# Patient Record
Sex: Female | Born: 1970 | Race: White | Hispanic: No | Marital: Married | Smoking: Former smoker
Health system: Southern US, Community
[De-identification: ages and names within clinical notes are randomized; demographics above are authoritative.]

## PROBLEM LIST (undated history)

## (undated) DIAGNOSIS — E079 Disorder of thyroid, unspecified: Secondary | ICD-10-CM

---

## 1998-11-11 ENCOUNTER — Other Ambulatory Visit: Admission: RE | Admit: 1998-11-11 | Discharge: 1998-11-11 | Payer: Self-pay | Admitting: Obstetrics and Gynecology

## 2002-07-21 ENCOUNTER — Inpatient Hospital Stay (HOSPITAL_COMMUNITY): Admission: EM | Admit: 2002-07-21 | Discharge: 2002-07-23 | Payer: Self-pay | Admitting: Emergency Medicine

## 2005-04-10 ENCOUNTER — Other Ambulatory Visit: Admission: RE | Admit: 2005-04-10 | Discharge: 2005-04-10 | Payer: Self-pay | Admitting: Obstetrics and Gynecology

## 2005-11-01 ENCOUNTER — Ambulatory Visit: Payer: Self-pay | Admitting: Family Medicine

## 2005-11-09 ENCOUNTER — Ambulatory Visit: Payer: Self-pay | Admitting: Family Medicine

## 2005-11-15 ENCOUNTER — Ambulatory Visit: Payer: Self-pay | Admitting: Family Medicine

## 2005-12-10 ENCOUNTER — Ambulatory Visit: Payer: Self-pay | Admitting: Endocrinology

## 2005-12-14 ENCOUNTER — Ambulatory Visit: Payer: Self-pay | Admitting: Family Medicine

## 2006-01-30 ENCOUNTER — Ambulatory Visit: Payer: Self-pay | Admitting: Family Medicine

## 2006-04-16 ENCOUNTER — Ambulatory Visit: Payer: Self-pay | Admitting: Family Medicine

## 2006-04-28 DIAGNOSIS — E039 Hypothyroidism, unspecified: Secondary | ICD-10-CM | POA: Insufficient documentation

## 2006-09-04 ENCOUNTER — Ambulatory Visit: Payer: Self-pay | Admitting: Family Medicine

## 2006-11-04 ENCOUNTER — Ambulatory Visit: Payer: Self-pay | Admitting: Family Medicine

## 2006-11-04 LAB — CONVERTED CEMR LAB
Basophils Absolute: 0 10*3/uL (ref 0.0–0.1)
Glucose, Bld: 91 mg/dL (ref 70–99)
LDL Cholesterol: 94 mg/dL (ref 0–99)
MCV: 91.5 fL (ref 78.0–100.0)
Monocytes Absolute: 0.5 10*3/uL (ref 0.2–0.7)
Monocytes Relative: 7 % (ref 3.0–11.0)
Neutro Abs: 4.6 10*3/uL (ref 1.4–7.7)
Neutrophils Relative %: 65.9 % (ref 43.0–77.0)
Total CHOL/HDL Ratio: 3
Triglycerides: 40 mg/dL (ref 0–149)
VLDL: 8 mg/dL (ref 0–40)
WBC: 7 10*3/uL (ref 4.5–10.5)

## 2006-11-05 ENCOUNTER — Telehealth (INDEPENDENT_AMBULATORY_CARE_PROVIDER_SITE_OTHER): Payer: Self-pay | Admitting: *Deleted

## 2006-11-28 ENCOUNTER — Encounter: Payer: Self-pay | Admitting: *Deleted

## 2006-12-03 ENCOUNTER — Encounter (INDEPENDENT_AMBULATORY_CARE_PROVIDER_SITE_OTHER): Payer: Self-pay | Admitting: Family Medicine

## 2007-01-06 ENCOUNTER — Telehealth (INDEPENDENT_AMBULATORY_CARE_PROVIDER_SITE_OTHER): Payer: Self-pay | Admitting: *Deleted

## 2007-01-06 ENCOUNTER — Ambulatory Visit: Payer: Self-pay | Admitting: Family Medicine

## 2007-01-07 ENCOUNTER — Ambulatory Visit: Payer: Self-pay | Admitting: Family Medicine

## 2007-04-01 ENCOUNTER — Telehealth: Payer: Self-pay | Admitting: Internal Medicine

## 2007-04-01 ENCOUNTER — Ambulatory Visit: Payer: Self-pay | Admitting: Family Medicine

## 2007-04-02 LAB — CONVERTED CEMR LAB: TSH: 2.16 microintl units/mL (ref 0.35–5.50)

## 2007-04-03 ENCOUNTER — Encounter (INDEPENDENT_AMBULATORY_CARE_PROVIDER_SITE_OTHER): Payer: Self-pay | Admitting: *Deleted

## 2007-04-08 ENCOUNTER — Telehealth (INDEPENDENT_AMBULATORY_CARE_PROVIDER_SITE_OTHER): Payer: Self-pay | Admitting: *Deleted

## 2007-05-07 ENCOUNTER — Telehealth (INDEPENDENT_AMBULATORY_CARE_PROVIDER_SITE_OTHER): Payer: Self-pay | Admitting: *Deleted

## 2007-08-29 ENCOUNTER — Telehealth (INDEPENDENT_AMBULATORY_CARE_PROVIDER_SITE_OTHER): Payer: Self-pay | Admitting: *Deleted

## 2007-09-05 ENCOUNTER — Ambulatory Visit: Payer: Self-pay | Admitting: Internal Medicine

## 2007-09-10 ENCOUNTER — Encounter (INDEPENDENT_AMBULATORY_CARE_PROVIDER_SITE_OTHER): Payer: Self-pay | Admitting: *Deleted

## 2007-10-07 ENCOUNTER — Telehealth (INDEPENDENT_AMBULATORY_CARE_PROVIDER_SITE_OTHER): Payer: Self-pay | Admitting: *Deleted

## 2008-02-16 ENCOUNTER — Ambulatory Visit: Payer: Self-pay | Admitting: Family Medicine

## 2008-02-17 ENCOUNTER — Encounter: Payer: Self-pay | Admitting: Family Medicine

## 2008-02-18 LAB — CONVERTED CEMR LAB
AST: 16 units/L (ref 0–37)
Albumin: 4.7 g/dL (ref 3.5–5.2)
Alkaline Phosphatase: 49 units/L (ref 39–117)
BUN: 16 mg/dL (ref 6–23)
Calcium: 9 mg/dL (ref 8.4–10.5)
Chloride: 103 meq/L (ref 96–112)
Glucose, Bld: 89 mg/dL (ref 70–99)
Potassium: 4 meq/L (ref 3.5–5.3)
Sodium: 138 meq/L (ref 135–145)
Total Protein: 7.8 g/dL (ref 6.0–8.3)

## 2008-07-01 ENCOUNTER — Ambulatory Visit: Payer: Self-pay | Admitting: Family Medicine

## 2008-09-15 ENCOUNTER — Telehealth: Payer: Self-pay | Admitting: Family Medicine

## 2008-09-18 ENCOUNTER — Encounter: Payer: Self-pay | Admitting: Family Medicine

## 2009-04-29 ENCOUNTER — Encounter: Payer: Self-pay | Admitting: Family Medicine

## 2009-04-29 ENCOUNTER — Telehealth: Payer: Self-pay | Admitting: Family Medicine

## 2009-05-02 LAB — CONVERTED CEMR LAB: TSH: 2.938 microintl units/mL (ref 0.350–4.500)

## 2009-06-16 ENCOUNTER — Ambulatory Visit: Payer: Self-pay | Admitting: Family Medicine

## 2009-06-16 DIAGNOSIS — G43909 Migraine, unspecified, not intractable, without status migrainosus: Secondary | ICD-10-CM | POA: Insufficient documentation

## 2009-06-17 LAB — CONVERTED CEMR LAB: T3, Free: 2.9 pg/mL (ref 2.3–4.2)

## 2009-08-10 ENCOUNTER — Encounter: Payer: Self-pay | Admitting: Family Medicine

## 2009-12-02 ENCOUNTER — Ambulatory Visit: Payer: Self-pay | Admitting: Family Medicine

## 2009-12-02 DIAGNOSIS — R21 Rash and other nonspecific skin eruption: Secondary | ICD-10-CM | POA: Insufficient documentation

## 2009-12-05 ENCOUNTER — Encounter: Payer: Self-pay | Admitting: Family Medicine

## 2009-12-13 ENCOUNTER — Encounter: Payer: Self-pay | Admitting: Family Medicine

## 2010-01-16 ENCOUNTER — Telehealth: Payer: Self-pay | Admitting: Family Medicine

## 2010-03-14 ENCOUNTER — Telehealth: Payer: Self-pay | Admitting: Family Medicine

## 2010-03-17 ENCOUNTER — Encounter: Payer: Self-pay | Admitting: Family Medicine

## 2010-04-13 NOTE — Progress Notes (Signed)
Summary: Needs TSH check

## 2010-04-13 NOTE — Assessment & Plan Note (Signed)
Summary: Hypothyroid, migraine   Vital Signs:  Patient profile:   40 year old female Height:      65 inches Weight:      168 pounds BMI:     28.06 Pulse rate:   81 / minute BP sitting:   126 / 71  (left arm) Cuff size:   regular  Vitals Entered By: Kathlene November (June 16, 2009 8:21 AM) CC: followup on thyroid med, Headache   Primary Care Provider:  Nani Gasser, MD  CC:  followup on thyroid med and Headache.  History of Present Illness: followup on thyroid med.  Was feeling less energic and TSH was borderline elevated and so we increased her dose. Has had more oily skin, loss of hair, and hair went straight, and lack of energy.  Doesn't really feel that different on the new dose of thyroid med. She is on the brand.  Migaine HA.  Hasn't had one for several months until yesterday but it was severe and she was very nauseated. Her imitrex has not been working well and she would like to try something else.  Not typically nauseated but was yesterday.  Heard about maxalt and wants to try that one.   Current Medications (verified): 1)  Synthroid 75 Mcg  Tabs (Levothyroxine Sodium) .Marland Kitchen.. 1 By Mouth Qd 2)  Multivitamins  Tabs (Multiple Vitamin) .... Take 1 Tablet By Mouth Once A Day 3)  Sumatriptan Succinate 100 Mg Tabs (Sumatriptan Succinate) .... Take As Directed 4)  Synthroid 88 Mcg Tabs (Levothyroxine Sodium) .... Take 1 Tablet By Mouth Once A Day Only 2 Days A Week.  Allergies (verified): No Known Drug Allergies  Comments:  Nurse/Medical Assistant: The patient's medications and allergies were reviewed with the patient and were updated in the Medication and Allergy Lists. Kathlene November (June 16, 2009 8:22 AM)  Physical Exam  General:  Well-developed,well-nourished,in no acute distress; alert,appropriate and cooperative throughout examination Skin:  no rashes.   Psych:  Cognition and judgment appear intact. Alert and cooperative with normal attention span and concentration. No  apparent delusions, illusions, hallucinations   Impression & Recommendations:  Problem # 1:  HYPOTHYROIDISM (ICD-244.9) Due to recheck and make sure at goal. If normal then consider tiral of compounding thyroid med or screening for depression. Does have a straong family hx of depression.  Her updated medication list for this problem includes:    Synthroid 75 Mcg Tabs (Levothyroxine sodium) .Marland Kitchen... 1 by mouth qd    Synthroid 88 Mcg Tabs (Levothyroxine sodium) .Marland Kitchen... Take 1 tablet by mouth once a day only 2 days a week.  Labs Reviewed: TSH: 2.938 (04/29/2009)    Chol: 153 (11/04/2006)   HDL: 51.3 (11/04/2006)   LDL: 94 (11/04/2006)   TG: 40 (11/04/2006)  Orders: T-TSH ([id number]) T-T3, Free [id number]) T-T4, Free ([id number])  Problem # 2:  MIGRAINE HEADACHE (ICD-346.90) Imitrex is not working well. Occ nauseated with her migraines. Will change to maxalt. let me know if not working.  Her updated medication list for this problem includes:    Sumatriptan Succinate 100 Mg Tabs (Sumatriptan succinate) .Marland Kitchen... Take as directed    Maxalt 10 Mg Tabs (Rizatriptan benzoate) .Marland Kitchen... Take 1 tablet by mouth once a day as needed  Complete Medication List: 1)  Synthroid 75 Mcg Tabs (Levothyroxine sodium) .Marland Kitchen.. 1 by mouth qd 2)  Multivitamins Tabs (Multiple vitamin) .... Take 1 tablet by mouth once a day 3)  Sumatriptan Succinate 100 Mg Tabs (Sumatriptan succinate) .... Take as directed  4)  Synthroid 88 Mcg Tabs (Levothyroxine sodium) .... Take 1 tablet by mouth once a day only 2 days a week. 5)  Maxalt 10 Mg Tabs (Rizatriptan benzoate) .... Take 1 tablet by mouth once a day as needed Prescriptions: MAXALT 10 MG TABS (RIZATRIPTAN BENZOATE) Take 1 tablet by mouth once a day as needed  #12 x 0   Entered and Authorized by:   Nani Gasser MD   Signed by:   Nani Gasser MD on 06/16/2009   Method used:   [facility]        [facility] [phone][facility] (retail)       [address] [place], [place]  [address]       Ph: [phone] or [phone]       Fax: [phone]   RxID:   [id number]

## 2010-04-13 NOTE — Progress Notes (Signed)
Summary: Wants birth control pill  Phone Note Call from Patient Call back at Home Phone [phone]   Caller: Patient Call For: Nani Gasser MD Summary of Call: Pt wants on BCP. Did have Mirena in but had to have taken out due to some complications from it. Does not want to go back and see her GYN wants to have you do her exams but not due until next year. Pt would like to try Seasonique. [facility] [address] [place] Initial call taken by: Kathlene November LPN,  January 16, 2010 1:29 PM  Follow-up for Phone Call        OK will send.  Follow-up by: Nani Gasser MD,  January 16, 2010 3:08 PM  Additional Follow-up for Phone Call Additional follow up Details #1::        pt notified Additional Follow-up by: Avon Gully CMA, Duncan Dull),  January 16, 2010 5:00 PM    New/Updated Medications: SEASONIQUE 0.15-0.03 &0.01 MG TABS (LEVONORGEST-ETH ESTRAD 91-DAY) Take 1 tablet by mouth once a day Prescriptions: SEASONIQUE 0.15-0.03 &0.01 MG TABS (LEVONORGEST-ETH ESTRAD 91-DAY) Take 1 tablet by mouth once a day  #1 pack x 3   Entered and Authorized by:   Nani Gasser MD   Signed by:   Nani Gasser MD on 01/16/2010   Method used:   [facility]ically to        [facility] [phone]* (retail)       [address] [place], [place]  [address]       Ph: [phone] or [phone]       Fax: [phone]   RxID:   [id number]

## 2010-04-13 NOTE — Miscellaneous (Signed)
Summary: Thyroid  Clinical Lists Changes  Medications: Changed medication from SYNTHROID 88 MCG TABS (LEVOTHYROXINE SODIUM) Take 1 tablet by mouth once a day only 2 days a week. [BMN] to * THYROID 1.2 GRAIN SR CAPSULE 1 capsule by mouth daily [BMN] - Signed Rx of THYROID 1.2 GRAIN SR CAPSULE 1 capsule by mouth daily;  #30 x 3 Brand medically necessary;  Signed;  Entered by: Seymour Bars DO;  Authorized by: Seymour Bars DO;  Method used: [facility]  [address] [phone]*, [address], [place], [place]  [address], Ph: [phone] or [phone], Fax: [phone]    Prescriptions: THYROID 1.2 GRAIN SR CAPSULE 1 capsule by mouth daily Brand medically necessary #30 x 3   Entered and Authorized by:   Seymour Bars DO   Signed by:   Seymour Bars DO on 08/10/2009   Method used:   Telephoned to ...       [facility]  [place] [phone]* (retail)       [address] [place], [place]  [address]       Ph: [phone] or [phone]       Fax: [phone]   RxID:   [id number]

## 2010-04-13 NOTE — Medication Information (Signed)
Summary: Approval for Triptan/[employer]  Approval for Triptan/[employer]   Imported By: Lanelle Bal 12/28/2009 10:48:25  _____________________________________________________________________  External Attachment:    Type:   Image     Comment:   External Document

## 2010-04-13 NOTE — Assessment & Plan Note (Signed)
Summary: Rash, migraines, thyroid   Vital Signs:  Patient profile:   40 year old female Height:      65 inches Weight:      168 pounds Pulse rate:   58 / minute BP sitting:   140 / 93  (right arm) Cuff size:   regular  Vitals Entered By: Avon Gully CMA, Duncan Dull) (December 02, 2009 3:25 PM) CC: rash on the back x 2-3 days, very itchy   Primary Care Loys Hoselton:  Nani Gasser, MD  CC:  rash on the back x 2-3 days and very itchy.  History of Present Illness: rash on the back x 2-3 days, very itchy. Never had this before.  Rash has similar rash.  Tried cortisone but it burned.  Husband uses lotramin on his rash. no fever.  He breaks about about twice a month. Usually lasts fora week. Husband has never had his diagnosed.  Pt with no alleviating sxs.      Current Medications (verified): 1)  Compunded Thyroid Hormone 1.2 Grains .Marland Kitchen.. 1 By Mouth Once Daily 2)  Multivitamins  Tabs (Multiple Vitamin) .... Take 1 Tablet By Mouth Once A Day 3)  Sumatriptan Succinate 100 Mg Tabs (Sumatriptan Succinate) .... Take As Directed 4)  Thyroid 1.2 Grain Sr Capsule .Marland KitchenMarland Kitchen. 1 Capsule By Mouth Daily 5)  Maxalt 10 Mg Tabs (Rizatriptan Benzoate) .... Take 1 Tablet By Mouth Once A Day As Needed  Allergies (verified): No Known Drug Allergies  Comments:  Nurse/Medical Assistant: The patient's medications and allergies were reviewed with the patient and were updated in the Medication and Allergy Lists. Avon Gully CMA, Duncan Dull) (December 02, 2009 3:26 PM)  Past History:  Past Medical History: Last updated: 02/16/2008 Hypothyroidism Gyn - [facility] in [place] - Dr. Richardean Chimera  Physical Exam  General:  Well-developed,well-nourished,in no acute distress; alert,appropriate and cooperative throughout examination Skin:  Right low back with erythematou base with papular lesions and a few very small vesicles     Impression & Recommendations:  Problem # 1:  SKIN RASH  (ICD-782.1) BAse don exam I am most suspicous of herpes Viral culture obtained Will tx with acyclovir while await the cutlure.   Discussed basics of the potential dx.  Orders: T-Herpes Culture (Routine) ([id number])  Problem # 2:  MIGRAINE HEADACHE (ICD-346.90) She is not well controlled right now.  Wil try zomig since failed imitrex and maxalt. Also called in a a few fiorecet.  F/U in one month to discuss maybe prophylactic medications.  The following medications were removed from the medication list:    Sumatriptan Succinate 100 Mg Tabs (Sumatriptan succinate) .Marland Kitchen... Take as directed    Maxalt 10 Mg Tabs (Rizatriptan benzoate) .Marland Kitchen... Take 1 tablet by mouth once a day as needed Her updated medication list for this problem includes:    Zomig 5 Mg Tabs (Zolmitriptan) .Marland Kitchen... Take 1 tablet by mouth once a day. can repeat in 2 hours    Fioricet 50-325-40 Mg Tabs (Butalbital-apap-caffeine) .Marland Kitchen... 1- 2 tab once daily as needed for migraine.  Problem # 3:  HYPOTHYROIDISM (ICD-244.9) Due to recheck her levels.  Orders: T-TSH [phone])  Orders: T-TSH [phone])  Complete Medication List: 1)  Compunded Thyroid Hormone 1.2 Grains  .Marland KitchenMarland Kitchen. 1 by mouth once daily 2)  Multivitamins Tabs (Multiple vitamin) .... Take 1 tablet by mouth once a day 3)  Thyroid 1.2 Grain Sr Capsule  .Marland Kitchen.. 1 capsule by mouth daily 4)  Zomig 5 Mg Tabs (Zolmitriptan) .... Take 1 tablet by  mouth once a day. can repeat in 2 hours 5)  Fioricet 50-325-40 Mg Tabs (Butalbital-apap-caffeine) .Marland Kitchen.. 1- 2 tab once daily as needed for migraine. 6)  Zovirax 400 Mg Tabs (Acyclovir) .... Take 1 tablet by mouth three times a day for 10 days  Patient Instructions: 1)  Please schedule a follow-up appointment in 1 month for migraines.  Prescriptions: ZOVIRAX 400 MG TABS (ACYCLOVIR) Take 1 tablet by mouth three times a day for 10 days  #30 x 0   Entered and Authorized by:   Nani Gasser MD   Signed by:   Nani Gasser MD on  12/02/2009   Method used:   [facility]ronically to        [facility] [phone]* (retail)       [address] [place], [place]  [address]       Ph: [phone] or [phone]       Fax: [phone]   RxID:   [id number] FIORICET 50-325-40 MG TABS (BUTALBITAL-APAP-CAFFEINE) 1- 2 tab once daily as needed for migraine.  #15 x 0   Entered and Authorized by:   Nani Gasser MD   Signed by:   Nani Gasser MD on 12/02/2009   Method used:   Electronically to        [facility] [phone]* (retail)       [address] [place], [place]  [address]       Ph: [phone] or [phone]       Fax: [phone]   RxID:   [id number] ZOMIG 5 MG TABS (ZOLMITRIPTAN) Take 1 tablet by mouth once a day. Can repeat in 2 hours  #6 x 0   Entered and Authorized by:   Nani Gasser MD   Signed by:   Nani Gasser MD on 12/02/2009   Method used:   Electronically to        [facility] [phone]* (retail)       [address] [place], [place]  [address]       Ph: [phone] or [phone]       Fax: [phone]   RxID:   [id number]

## 2010-04-13 NOTE — Progress Notes (Signed)
Summary: BCP side effects  Phone Note Call from Patient Call back at [phone]   Caller: Patient Call For: Nani Gasser MD Summary of Call: Pt is in her second month of seasonique. Pt started her period 12/23 and is still having a full blown period with cramping and bloating. Pt is also having night sweats, which started at the end of  dec. Also thinks this is affecting thyroid medications because she is feeling tired and down and has the "blues".Please advise Initial call taken by: Avon Gully CMA, Duncan Dull),  March 14, 2010 11:40 AM  Follow-up for Phone Call        Let recheck her thyroid as has been 3 months so need to recheck anyway. If she would like to change birth controls we can change to more traditional 1 month pill if wouldlike.  Follow-up by: Nani Gasser MD,  March 14, 2010 12:16 PM  Additional Follow-up for Phone Call Additional follow up Details #1::        pt notified.will go to lab.order faxed Additional Follow-up by: Avon Gully CMA, Duncan Dull),  March 14, 2010 1:06 PM     Appended Document: BCP side effects 03/14/09 acm 1:34 }  pt states she will just stay on the seasonique and "tough it out" because she really likes only havinga period 4 times a year. Will schedule appt if new se occur if she cant stand it any longer.

## 2010-07-28 NOTE — Discharge Summary (Signed)
NAMEMARCI, POLITO                           ACCOUNT NO.:  [id number]   MEDICAL RECORD NO.:  [id number]                   PATIENT TYPE:  INP   LOCATION:  4098                                 FACILITY:  [facility]   PHYSICIAN:  Sherin Quarry, MD                   DATE OF BIRTH:  02-17-1971   DATE OF ADMISSION:  07/21/2002  DATE OF DISCHARGE:                                 DISCHARGE SUMMARY   HISTORY:  Sarah Huerta is a previously healthy 40 year old lady who reported  the onset of headache and backache about one week prior to admission.  The  same night, she developed chills and fever.  The following Wednesday, she  went to [facility] and was diagnosed with a urinary tract infection due to  the presence of hematuria.  She was given an antibiotic, possibly a  quinolone, with instructions to take 1 tablet daily for 7 days.  On Tuesday,  she developed a temperature up to 104.  On Friday, she developed a truncal  rash, which definitely began in the chest and abdomen area and then spread  subsequently to the extremities.  Out of concern about possible [facility] Spotted Fever, she was placed on doxycycline 100 mg b.i.d.  Headache and backache seemed to improve, but the rash appeared to have  worsened, and the patient was complaining of severe burning of her skin as  well as nausea.  She denied any associated cough, shortness of breath, chest  pain, abdominal pain, change in bowel habits, dysuria or menstrual problems.  She, therefore, presented to the [facility] on May 11 with  the associated worsening rash.   PHYSICAL EXAMINATION:  VITAL SIGNS:  Her temperature is 100.6; pulse is 111,  blood pressure 113/63.  GENERAL:  This was a somewhat obese lady who had a generalized macular rash.  The patient did not appear gravely ill but was quite uncomfortable from the  burning and itching from the rash.  HEENT:  Exam was within normal limits.  CHEST:  The chest was clear.  BACK:  Examination of the back revealed no CVA or point tenderness.  CARDIOVASCULAR:  Exam reveals normal S1 and S2.  There are no rubs, murmurs  or gallops.  ABDOMEN:  The abdomen was benign.  There are normal bowel sounds.  There are  no masses or tenderness.  No guarding or rebound.  There was no  organomegaly.  NEUROLOGIC:  Testing was within normal limits.  EXTREMITIES:  The examination of extremities revealed a generalized  blanching macular rash present, diffusely, on the trunk and extremities.  It  was on the dorsum of the hands and feet but no on the palms or soles.  There  was no petechial rash.  There was no cyanosis or edema.   LABORATORY STUDIES:  Initial laboratory studies included the white count of  9,300;  hemoglobin was 11.9; there were 83% neutrophils; the platelet count  was 173,000.  Sodium was 134; potassium was 3.0; glucose is 111; creatinine  was 0.7.  Initially, liver function studies revealed an AST of 382, ALT of  365, alkaline phosphatase of 117, total bilirubin of 1.2.  Serial blood  cultures were obtained and are negative to date.  A [place] Spotted  Fever IgM and IgG serology were obtained, and these results are pending.  Urine studies were remarkable only for the presence of ketones.  Monospot  was negative.  Thyroid profile was normal.  Hepatitis profile was obtained.  This revealed negative hepatitis A IgM, hepatitis B surface antigen and  antibody, hepatitis B core IgM and hepatitis C serology.  Subsequently, a  liver profile was monitored and improved steadily on each day.  By May 13,  the SGOT was 104, SGPT 232, total bilirubin and alkaline phosphatase  remained normal.   HOSPITAL COURSE:  On admission, the patient was placed on IV of D5-1/2  normal saline with 10 mEq of KCl at 150 cc/hour.  Phenergan was administered  for nausea.  The patient was placed on doxycycline 100 mg IV every 12 hours  and was given Atarax 25 mg t.i.d. for rash.    The patient's condition improved strikingly during the course of her  hospitalization.  She defervesced.  The rash appeared to completely resolve,  and, by May 13, she was feeling quite well.  By my calculation, May 13 was  the sixth day, total, of doxycycline therapy.  Therefore, on May 13, it was  felt reasonable to discharge the patient.   DISCHARGE DIAGNOSIS:  1. Febrile illness with rash, rule out [facility] Spotted Fever.  2. Nausea and vomiting of uncertain etiology.  3. Abnormal liver function studies, possibly associated with febrile     illness, consider a viral origin.   DISCHARGE MEDICATIONS:  The patient was instructed to continue Atarax 25 mg  t.i.d. for another 5 to 7 days.  She was also advised to continue  doxycycline 100 mg b.i.d. for 5 additional days.   FOLLOW UP:  She will follow up with Dr. Blossom Hoops at [facility], who is the  physician that sees her husband.  It would be advisable to obtain a  convalescent IgG serology to confirm the diagnosis of [facility] Spotted  Fever.  If I receive any reports about these serologies for this patient, I  will certainly let Dr. Blossom Hoops know.   DISCHARGE CONDITION:  Her condition at the time of discharge was good.                                               Sherin Quarry, MD    SY/MEDQ  D:  07/23/2002  T:  07/23/2002  Job:  [id number]   cc:   Dr. Soundra Pilon [facility]

## 2010-07-28 NOTE — Consult Note (Signed)
[facility]                            ENDOCRINOLOGY CONSULTATION   Sarah Huerta, Sarah Huerta                        MRN:          [id number]  DATE:12/10/2005                            DOB:          05-31-1970    REFERRING PHYSICIAN:  Leanne Chang, M.D.   REASON FOR REFERRAL:  Hypothyroidism.   HISTORY OF PRESENT ILLNESS:  41 year old woman who states that in January  2007, she was found to have a slightly elevated TSH by Dr. Arelia Sneddon.  It was  repeated in April and it was again abnormal and he prescribed Synthroid 50  mcg a day.  Upon a recheck in May 2007, the TSH value indicated a need to  increase it to 75 mcg a day.  On 75 mcg a day, she states that she started  to have some moderate headache with a question of associated staining on her  teeth.  On November 09, 2005, her TSH was 4.11 on Synthroid 75 mcg a day.  Because of the above symptoms, she discontinued the Synthroid.  She states  that since then she has had some unexplained crying spells.   PAST MEDICAL HISTORY:  Otherwise  healthy, no other medications.   SOCIAL HISTORY:  She is married, she works as a Dance movement psychotherapist.   FAMILY HISTORY:  Positive for depression.  Her mother takes an uncertain  type of thyroid medication.   REVIEW OF SYSTEMS:  She has gained 7 pounds in the past seven months or so.  She denies sore throat.   PHYSICAL EXAMINATION:  VITAL SIGNS:  Blood pressure 129/82, heart rate 95, temperature 99, weight  178.  GENERAL:  In no distress.  SKIN:  Not diaphoretic.  EYES:  No proptosis, no periorbital swelling.  NECK:  Thyroid is slightly enlarged with an irregular surface.  CHEST:  Clear to auscultation, no respiratory distress.  CARDIOVASCULAR:  No JVD, no edema, regular rate and rhythm, no murmur.  Gait  is observed in the office to be normal.  NEUROLOGICAL:  She is alert, she is well oriented.  She is tearful during  the visit.  Sensation is diffusely intact to touch  and there is no tremor.   IMPRESSION:  1. Mild hypothyroidism.  2. Perceived intolerance to Synthroid characterized by headache and      question of staining on the teeth.  3. Although she states she does not have any personal history of      depression, that would appear to be a reasonable diagnosis here.   PLAN:  1. The patient requests repeat of her TSH off the Synthroid.  Later this      week, it will have been a month since she has taken the Synthroid, so      she will have a TSH checked then.  2. We discussed the fact that I believe that her Synthroid side-effects      are coincidental.            ______________________________  Cleophas Dunker. Everardo All, MD     SAE/MedQ  DD:  12/11/2005  DT:  12/12/2005  Job #:  [id number]   cc:   Leanne Chang, M.D.  Juluis Mire, M.D.

## 2010-07-28 NOTE — H&P (Signed)
Sarah Huerta, Sarah Huerta                           ACCOUNT NO.:  [id number]   MEDICAL RECORD NO.:  [id number]                   PATIENT TYPE:  EMS   LOCATION:  ED                                   FACILITY:  [facility]   PHYSICIAN:  Sherin Quarry, MD                   DATE OF BIRTH:  11/15/1970   DATE OF ADMISSION:  07/21/2002  DATE OF DISCHARGE:                                HISTORY & PHYSICAL   HISTORY OF PRESENT ILLNESS:  Sarah Huerta is a previously healthy 40 year old  woman who reported that one week prior to this presentation, she suddenly  developed the onset of headache and backache.  That night, she developed  chills and fever.  The next day, she went to [facility], where workup done  was remarkable for the presence of blood in the urine.  The patient was,  therefore, diagnosed with a urinary tract infection.  She was given an  antibiotic that she was instructed to take once per day for 7 days, possibly  a quinolone.  The next day, her temperature was up to 104 degrees.  On  Friday, the patient developed a truncal rash, and because of concern about  possible [facility] spotted fever, was placed on doxycycline 100 mg  b.i.d.  She states that her headache and backache gradually resolved such  that by Saturday these problems were pretty much gone.  However, the rash  progressively worsened and was associated with a feeling of burning of her  skin and nausea.  She states that she had no associated earache, sinus pain,  sore throat, cough, wheezing, shortness of breath, chest pain, abdominal  pain, change in bowel habits, dysuria, hematuria or change in her menstrual  periods.  Her last menstrual period was April 25.  She generally takes no  medications.  There is no recent history of travel.  After evaluation in the  emergency room, it was felt prudent to admit the patient for observation and  treatment.   PAST MEDICAL HISTORY:  None.   ALLERGIES:  None.   MEDICATIONS:  The  patient takes no medications regularly except as noted  above.   OPERATIONS:  She has had a previous C-section.   ILLNESSES:  None.   FAMILY HISTORY:  The patient's father died of a trauma.  Her mother has  history of diabetes and hypertension.  Her sister has a thyroid problem.   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  No one that she works with has been sick.  Her son, who is 65 years old, is  in good health and has not had any recent viral illnesses.  Her husband is  also well.  She has had no recent travel history.  She denies use of alcohol  or drugs.   REVIEW OF SYSTEMS:  HEAD:  See above.  EYES:  She denies visual blurring,  diplopia.  EAR, NOSE & THROAT:  She denies earache, sinus pain or sore  throat.  CHEST:  She denies coughing, wheezing or chest congestion.  CARDIOVASCULAR:  She denies orthopnea, PND or ankle edema.  GASTROINTESTINAL:  She has been nauseated.  She denies hematemesis or  melena.  There has been no abdominal pain.  GENITOURINARY:  There has been  no dysuria, hematuria or nocturia.  RHEUMATOLOGIC:  She has had back  discomfort.  There has been no joint discomfort.  NEUROLOGIC:  There is no  history of seizure or stroke.  EXTREMITIES:  Please see above.   PHYSICAL EXAMINATION:  VITAL SIGNS:  The temperature is 100.6; pulse is 111,  blood pressure 113/63; oxygen saturation is 98%.  GENERAL:  The patient is a somewhat obese lady who has a generalized rash.  She does not appear seriously ill, although the rash is quite uncomfortable  to her.  HEENT:  Within normal limits.  CHEST:  Clear to auscultation and percussion.  BACK:  Revealed no CVA or point tenderness.  CARDIOVASCULAR:  Revealed normal S1 and S2.  There are no rubs, murmurs or  gallops.  The heart rate was slightly increased.  ABDOMEN:  Was very benign.  There were normal bowel sounds.  There are no  masses or tenderness.  No organomegaly.  NEUROLOGIC:  Testing  was within normal limits.  EXTREMITIES:  Reveals a generalized blanching macular rash, which is present  diffusely on the trunk and extremities.  It is notable on the dorsum of the  hands and feet.  Does not seem to be present on the palms and soles.  There  is no petechial rash.  Examination of extremities otherwise reveals no  cyanosis or edema.   LABORATORY DATA:  The white count is 9,300, platelets 173,000; hemoglobin is  11.9.  The electrolytes are notable for a potassium of 3.0; the glucose is  111.  The SGOT is 382, SGPT 365, alkaline phosphatase 117; bilirubin level  is 1.2.   IMPRESSION:  1. Febrile illness with rash.  Although the white blood cell count, platelet     count and appearance of the rash would be very atypical for [facility] spotted fever, this condition should still be considered and     should be treated for.  Other possibilities would include enterovirus     infection, reaction to medication, although there is no obvious     associated medication here, mononucleosis, or hepatitis of viral origin.  2. Nausea and vomiting, possibly exacerbated by medications.  3. Possible history of urinary tract infection.  4. Abnormal liver function tests.   PLAN:  1. The patient will be admitted for intravenous fluids and observation.  2. We will continue doxycycline by the IV route.  3. We will obtain a hepatitis profile, Monospot, thyroid profile and also     follow liver functions.  4. [facility] spotted fever serology has been obtained and is pending.  5. We will also recheck the urinalysis.  6. We will follow the patient closely.                                               Sherin Quarry, MD    SY/MEDQ  D:  07/21/2002  T:  07/21/2002  Job:  [id number]

## 2010-09-22 ENCOUNTER — Telehealth: Payer: Self-pay | Admitting: Family Medicine

## 2010-09-22 DIAGNOSIS — E039 Hypothyroidism, unspecified: Secondary | ICD-10-CM

## 2010-09-22 NOTE — Telephone Encounter (Signed)
Pt needs thyroid level checked. I will refill thyroid med for one more months but needs to check her labs. We can fax order downstairs.

## 2010-09-25 NOTE — Telephone Encounter (Signed)
LMOM advising pt of rec and refill

## 2010-09-27 ENCOUNTER — Telehealth: Payer: Self-pay | Admitting: Family Medicine

## 2010-09-27 ENCOUNTER — Other Ambulatory Visit: Payer: Self-pay | Admitting: Family Medicine

## 2010-09-27 MED ORDER — AMBULATORY NON FORMULARY MEDICATION: Status: DC

## 2010-09-27 NOTE — Telephone Encounter (Signed)
Call pt: Thyroid looks good. I will send over RF.

## 2010-09-27 NOTE — Telephone Encounter (Signed)
Pt informed that her thyroid looked good on recent labs.  Told refill sent. Jarvis Newcomer, LPN Domingo Dimes

## 2010-10-13 ENCOUNTER — Encounter: Payer: Self-pay | Admitting: Family Medicine

## 2010-10-13 ENCOUNTER — Inpatient Hospital Stay (INDEPENDENT_AMBULATORY_CARE_PROVIDER_SITE_OTHER)
Admission: RE | Admit: 2010-10-13 | Discharge: 2010-10-13 | Disposition: A | Discharge status: Home / Self Care | Payer: BC Managed Care – PPO | Source: Ambulatory Visit | Attending: Family Medicine | Admitting: Family Medicine

## 2010-10-13 DIAGNOSIS — J042 Acute laryngotracheitis: Secondary | ICD-10-CM | POA: Insufficient documentation

## 2010-10-13 DIAGNOSIS — J209 Acute bronchitis, unspecified: Secondary | ICD-10-CM

## 2010-10-13 DIAGNOSIS — E785 Hyperlipidemia, unspecified: Secondary | ICD-10-CM | POA: Insufficient documentation

## 2010-10-13 DIAGNOSIS — E119 Type 2 diabetes mellitus without complications: Secondary | ICD-10-CM | POA: Insufficient documentation

## 2010-10-13 DIAGNOSIS — I10 Essential (primary) hypertension: Secondary | ICD-10-CM | POA: Insufficient documentation

## 2010-10-24 ENCOUNTER — Other Ambulatory Visit: Payer: Self-pay | Admitting: Family Medicine

## 2010-10-24 MED ORDER — AMBULATORY NON FORMULARY MEDICATION: Status: DC

## 2011-01-26 ENCOUNTER — Emergency Department
Admission: EM | Admit: 2011-01-26 | Discharge: 2011-01-26 | Disposition: A | Discharge status: Home / Self Care | Payer: BC Managed Care – PPO | Source: Home / Self Care | Attending: Family Medicine | Admitting: Family Medicine

## 2011-01-26 ENCOUNTER — Encounter: Payer: Self-pay | Admitting: *Deleted

## 2011-01-26 DIAGNOSIS — J069 Acute upper respiratory infection, unspecified: Secondary | ICD-10-CM

## 2011-01-26 HISTORY — DX: Disorder of thyroid, unspecified: E07.9

## 2011-01-26 MED ORDER — BENZONATATE 200 MG PO CAPS: 200.0000 mg | ORAL_CAPSULE | Freq: Every day | ORAL | Status: AC

## 2011-01-26 MED ORDER — AZITHROMYCIN 250 MG PO TABS: ORAL_TABLET | ORAL | Status: AC

## 2011-01-26 NOTE — ED Notes (Signed)
Patient c/o sinus drainage, productive cough, HA x 9 days. Taken Claritin, advil and cough drops.

## 2011-01-29 NOTE — ED Provider Notes (Signed)
History     CSN: [id number] Arrival date & time: 01/26/2011 10:34 AM   First MD Initiated Contact with Patient 01/26/11 1115      Chief Complaint  Patient presents with  . Cough  . Sinusitis     HPI Comments: Patient complains of approximately 9 day history of gradually progressive URI symptoms beginning with a mild sore throat (now improved), followed by progressive nasal congestion.  A cough started 8 days ago.  Complains of fatigue and initial myalgias.  Cough is now worse at night and generally productive during the day.  There has been no pleuritic pain, shortness of breath, or wheezes.  She now often coughs until she gags.  Her last tetanus shot was in 2008.  Patient is a 40 y.o. female presenting with cough and sinusitis. The history is provided by the patient.  Cough This is a new problem. The current episode started more than 1 week ago. The problem occurs hourly. The problem has been gradually worsening. The cough is productive of sputum. Maximum temperature: Low grade fever occasionally. The fever has been present for 1 to 2 days. Associated symptoms include chills, headaches, rhinorrhea and sore throat. Pertinent negatives include no chest pain, no sweats, no ear congestion, no ear pain, no myalgias, no shortness of breath, no wheezing and no eye redness. She has tried cough syrup for the symptoms. The treatment provided mild relief. She is not a smoker.  Sinusitis  Associated symptoms include chills, congestion, sore throat and cough. Pertinent negatives include no sweats, no ear pain, no sinus pressure and no shortness of breath.    Past Medical History  Diagnosis Date  . Thyroid disease     Past Surgical History  Procedure Date  . Cesarean section     History reviewed. No pertinent family history.  History  Substance Use Topics  . Smoking status: Former Smoker    Quit date: 01/26/1996  . Smokeless tobacco: Not on file  . Alcohol Use: No    OB History    Grav Para Term Preterm Abortions TAB SAB Ect Mult Living                  Review of Systems  Constitutional: Positive for fever and chills. Negative for activity change.  HENT: Positive for congestion, sore throat, rhinorrhea and postnasal drip. Negative for ear pain, trouble swallowing and sinus pressure.   Eyes: Negative for redness.  Respiratory: Positive for cough. Negative for shortness of breath and wheezing.   Cardiovascular: Negative for chest pain.  Gastrointestinal: Positive for constipation.  Genitourinary: Negative.   Musculoskeletal: Negative.  Negative for myalgias.  Skin: Negative.   Neurological: Positive for headaches.    Allergies  Review of patient's allergies indicates no known allergies.  Home Medications   Current Outpatient Rx  Name Route Sig Dispense Refill  . AMBULATORY NON FORMULARY MEDICATION  Medication Name: thyroid 1.75 grain SR Capsules T1C PO BID 30 capsule 5  . AZITHROMYCIN 250 MG PO TABS  Take 2 tabs today; then begin one tab once daily for 4 more days.  6 each 0  . BENZONATATE 200 MG PO CAPS Oral Take 1 capsule (200 mg total) by mouth at bedtime. 12 capsule 0    BP 123/84  Pulse 69  Temp(Src) 98.2 F (36.8 C) (Oral)  Resp 14  Ht 5\' 4"  (1.626 m)  Wt 161 lb (73.029 kg)  BMI 27.64 kg/m2  SpO2 100%  LMP 01/10/2011  Physical Exam  Nursing  note and vitals reviewed. Constitutional: She is oriented to person, place, and time. She appears well-developed and well-nourished. No distress.  HENT:  Head: Normocephalic.  Right Ear: External ear normal.  Left Ear: External ear normal.  Nose: Mucosal edema and rhinorrhea present. No sinus tenderness.  Mouth/Throat: Oropharynx is clear and moist. No oropharyngeal exudate.  Eyes: Conjunctivae and EOM are normal. Pupils are equal, round, and reactive to light. Right eye exhibits no discharge. Left eye exhibits no discharge.  Neck: Normal range of motion. Neck supple.  Cardiovascular: Normal rate,  regular rhythm and normal heart sounds.   Pulmonary/Chest: Effort normal and breath sounds normal. No respiratory distress. She has no wheezes. She has no rales. She exhibits no tenderness.  Abdominal: Soft. Bowel sounds are normal. There is no tenderness.  Musculoskeletal: She exhibits no edema.  Lymphadenopathy:    She has cervical adenopathy.       Right cervical: Posterior cervical adenopathy present.       Left cervical: Posterior cervical adenopathy present.  Neurological: She is alert and oriented to person, place, and time.  Skin: Skin is warm and dry. She is not diaphoretic.    ED Course  Procedures:  None      1. Acute upper respiratory infections of unspecified site       MDM  ? Pertussis Begin Azithromycin. Begin expectorant/decongestant, topical decongestant, saline nasal spray and/or saline irrigation, and cough suppressant at bedtime.  Avoid antihistamines for now. Increase fluid intake, rest. Followup with PCP if not improving 7 to 10 days.         Donna Christen, MD 01/29/11 [phone]

## 2011-02-12 NOTE — Progress Notes (Signed)
Summary: SORE THROAT/CONGESTION rm 5   Vital Signs:  Patient Profile:   40 Years Old Female CC:      congestion Height:     65 inches Weight:      156.25 pounds O2 Sat:      97 % O2 treatment:    Room Air Temp:     98.4 degrees F oral Pulse rate:   74 / minute Resp:     16 per minute BP sitting:   116 / 75  (left arm) Cuff size:   regular  Vitals Entered By: Clemens Catholic LPN (October 13, 2010 8:50 AM)                  Prior Medication List:  MULTIVITAMINS  TABS (MULTIPLE VITAMIN) Take 1 tablet by mouth once a day * THYROID 1.75 GRAIN SR CAPSULE 1 capsule by mouth daily [BMN] ZOMIG 5 MG TABS (ZOLMITRIPTAN) Take 1 tablet by mouth once a day. Can repeat in 2 hours FIORICET 50-325-40 MG TABS (BUTALBITAL-APAP-CAFFEINE) 1- 2 tab once daily as needed for migraine. ZOVIRAX 400 MG TABS (ACYCLOVIR) Take 1 tablet by mouth three times a day for 10 days SEASONIQUE 0.15-0.03 &0.01 MG TABS (LEVONORGEST-ETH ESTRAD 91-DAY) Take 1 tablet by mouth once a day   Updated Prior Medication List: MULTIVITAMINS  TABS (MULTIPLE VITAMIN) Take 1 tablet by mouth once a day * THYROID 1.75 GRAIN SR CAPSULE 1 capsule by mouth daily [BMN]  Current Allergies (reviewed today): No known allergies History of Present Illness Chief Complaint: congestion History of Present Illness: Patient has been out of the country on a mission trip. She went snorkaling on Monday before flying home on Tuesday and became sick on Wednesday. She has been running a fever, coughing,been  hoarse and has a sore throat.She has not been able to sleep at night either.   Current Problems: BRONCHITIS, ACUTE (ICD-466.0) ACUTE LARYNGOTRACHEITIS W/O MENTION OBSTRUCTION (ICD-464.20) HYPERTENSION (ICD-401.9) HYPERLIPIDEMIA (ICD-272.4) DIABETES MELLITUS, TYPE II (ICD-250.00) SKIN RASH (ICD-782.1) MIGRAINE HEADACHE (ICD-346.90) EXAMINATION, ROUTINE MEDICAL (ICD-V70.0) FAMILY HISTORY DIABETES 1ST DEGREE RELATIVE  (ICD-V18.0) HYPOTHYROIDISM (ICD-244.9)   Current Meds MULTIVITAMINS  TABS (MULTIPLE VITAMIN) Take 1 tablet by mouth once a day * THYROID 1.75 GRAIN SR CAPSULE 1 capsule by mouth daily [BMN] TUSSIONEX PENNKINETIC ER 8-10 MG/5ML LQCR (CHLORPHENIRAMINE-HYDROCODONE) 1/2 to 1 tsp by mouth twice aday ZITHROMAX Z-PAK 250 MG  TABS (AZITHROMYCIN) Use as directed  REVIEW OF SYSTEMS Constitutional Symptoms       Complains of fever, chills, and night sweats.     Denies weight loss, weight gain, and fatigue.  Eyes       Denies change in vision, eye pain, eye discharge, glasses, contact lenses, and eye surgery. Ear/Nose/Throat/Mouth       Complains of ear pain, sinus problems, and sore throat.      Denies hearing loss/aids, change in hearing, ear discharge, dizziness, frequent runny nose, frequent nose bleeds, hoarseness, and tooth pain or bleeding.  Respiratory       Complains of dry cough.      Denies productive cough, wheezing, shortness of breath, asthma, bronchitis, and emphysema/COPD.  Cardiovascular       Denies murmurs, chest pain, and tires easily with exhertion.    Gastrointestinal       Denies stomach pain, nausea/vomiting, diarrhea, constipation, blood in bowel movements, and indigestion. Genitourniary       Denies painful urination, kidney stones, and loss of urinary control. Neurological       Denies  paralysis, seizures, and fainting/blackouts. Musculoskeletal       Denies muscle pain, joint pain, joint stiffness, decreased range of motion, redness, swelling, muscle weakness, and gout.  Skin       Denies bruising, unusual mles/lumps or sores, and hair/skin or nail changes.  Psych       Denies mood changes, temper/anger issues, anxiety/stress, speech problems, depression, and sleep problems. Other Comments: pt c/o chest congestion, productive cough, hoarseness, bilateral ear pain, and sore throat x 2 days. she has taken Mucinex D and Advil.   Past History:  Family History: Last  updated: 11/04/2006 Family History Diabetes 1st degree relative Family History High cholesterol Family History Hypertension  Social History: Last updated: 02/16/2008 Occupation: pension Systems developer Married to [employer] and with one son.   Alcohol use-yes Drug use-no Regular exercise-yes Former Smoker  Risk Factors: Alcohol Use: <1 (02/16/2008) Caffeine Use: 1 (02/16/2008) Exercise: yes (11/04/2006)  Risk Factors: Smoking Status: quit (02/16/2008)  Past Medical History: Hypothyroidism Gyn - [facility] in [place] - Dr. Richardean Chimera Diabetes mellitus, type II Hyperlipidemia Hypertension  Past Surgical History: Reviewed history from 11/04/2006 and no changes required. c/s  Family History: Reviewed history from 11/04/2006 and no changes required. Family History Diabetes 1st degree relative Family History High cholesterol Family History Hypertension  Social History: Reviewed history from 02/16/2008 and no changes required. Occupation: pension Systems developer Married to [employer] and with one son.   Alcohol use-yes Drug use-no Regular exercise-yes Former Smoker Physical Exam General appearance: well developed, well nourished, no acute distress Head: normocephalic, atraumatic Ears: normal, no lesions or deformities Oral/Pharynx: pharyngeal erythema without exudate, uvula midline without deviation Neck: supple,anterior lymphadenopathy present Chest/Lungs: no rales, wheezes, or rhonchi bilateral, breath sounds equal without effort Heart: regular rate and  rhythm, no murmur Extremities: normal extremities Skin: no obvious rashes or lesions MSE: oriented to time, place, and person Assessment Problems:   SKIN RASH (ICD-782.1) MIGRAINE HEADACHE (ICD-346.90) EXAMINATION, ROUTINE MEDICAL (ICD-V70.0) FAMILY HISTORY DIABETES 1ST DEGREE RELATIVE (ICD-V18.0) HYPOTHYROIDISM (ICD-244.9) New Problems: BRONCHITIS, ACUTE (ICD-466.0) ACUTE LARYNGOTRACHEITIS W/O MENTION OBSTRUCTION  (ICD-464.20) HYPERTENSION (ICD-401.9) HYPERLIPIDEMIA (ICD-272.4) DIABETES MELLITUS, TYPE II (ICD-250.00)  tracheolaryngitis,  bronchitis  Patient Education: Patient and/or caregiver instructed in the following: rest fluids and Tylenol.  Plan New Medications/Changes: ZITHROMAX Z-PAK 250 MG  TABS (AZITHROMYCIN) Use as directed  #1 x 0, 10/13/2010, Hassan Rowan MD Sandria Senter ER 8-10 MG/5ML LQCR (CHLORPHENIRAMINE-HYDROCODONE) 1/2 to 1 tsp by mouth twice aday  #4 floz x 0, 10/13/2010, Hassan Rowan MD  New Orders: New Patient Level III [40981] Rapid Strep [19147] Follow Up: Follow up in 2-3 days if no improvement, Follow up on an as needed basis, Follow up with Primary Physician Work/School Excuse: Return to work/school in 3 days  The patient and/or caregiver has been counseled thoroughly with regard to medications prescribed including dosage, schedule, interactions, rationale for use, and possible side effects and they verbalize understanding.  Diagnoses and expected course of recovery discussed and will return if not improved as expected or if the condition worsens. Patient and/or caregiver verbalized understanding.  Prescriptions: ZITHROMAX Z-PAK 250 MG  TABS (AZITHROMYCIN) Use as directed  #1 x 0   Entered and Authorized by:   Hassan Rowan MD   Signed by:   Hassan Rowan MD on 10/13/2010   Method used:   Printed then faxed to ...       [facility]  [address] [phone]* (retail)       [address]  [place], [place]  [address]       Ph: [phone] or [phone]       Fax: [phone]   RxID:   [id number] Sandria Senter ER 8-10 MG/5ML LQCR (CHLORPHENIRAMINE-HYDROCODONE) 1/2 to 1 tsp by mouth twice aday  #4 floz x 0   Entered and Authorized by:   Hassan Rowan MD   Signed by:   Hassan Rowan MD on 10/13/2010   Method used:   Printed then faxed to ...       [facility]  [place] [phone]* (retail)       [address] [place], [place]  [address]       Ph: [phone] or  [phone]       Fax: [phone]   RxID:   [id number]   Patient Instructions: 1)  Please schedule a follow-up appointment as needed. 2)  Please schedule an appointment with your primary doctor in :3-7 days if notbetter 3)  Take your antibiotic as prescribed until ALL of it is gone, but stop if you develop a rash or swelling and contact our office as soon as possible. 4)  Acute bronchitis symptoms for less than 10 days are not helped by antibiotics. take over the counter cough medications. call if no improvment in  5-7 days, sooner if increasing cough, fever, or new symptoms( shortness of breath, chest pain). 5)  Tussionex may cause sedation so during the day time hours you may need to avoid or reduce the dosing  Orders Added: 1)  New Patient Level III [99203] 2)  Rapid Strep [23762]    Laboratory Results  Date/Time Received: October 13, 2010 9:06 AM  Date/Time Reported: October 13, 2010 9:06 AM   Other Tests  Rapid Strep: negative  Kit Test Internal QC: Negative   (Normal Range: Negative)

## 2012-02-25 ENCOUNTER — Encounter: Payer: Self-pay | Admitting: Physician Assistant

## 2012-02-25 ENCOUNTER — Ambulatory Visit (INDEPENDENT_AMBULATORY_CARE_PROVIDER_SITE_OTHER): Payer: BC Managed Care – PPO | Admitting: Physician Assistant

## 2012-02-25 VITALS — BP 115/79 | HR 83 | Resp 17 | Ht 64.0 in | Wt 173.0 lb

## 2012-02-25 DIAGNOSIS — R232 Flushing: Secondary | ICD-10-CM

## 2012-02-25 DIAGNOSIS — Z131 Encounter for screening for diabetes mellitus: Secondary | ICD-10-CM

## 2012-02-25 DIAGNOSIS — Z13220 Encounter for screening for lipoid disorders: Secondary | ICD-10-CM

## 2012-02-25 DIAGNOSIS — N951 Menopausal and female climacteric states: Secondary | ICD-10-CM

## 2012-02-25 DIAGNOSIS — E039 Hypothyroidism, unspecified: Secondary | ICD-10-CM

## 2012-02-25 DIAGNOSIS — Z0000 Encounter for general adult medical examination without abnormal findings: Secondary | ICD-10-CM

## 2012-02-25 NOTE — Patient Instructions (Addendum)
Will call with labs.   Consider calcium 500mg  twice a day with Vitamin D 800IU daily.   Continue exercise and healthy diet.   Call if interested in hormone studies.

## 2012-02-26 LAB — COMPLETE METABOLIC PANEL WITHOUT GFR
ALT: 11 U/L (ref 0–35)
AST: 14 U/L (ref 0–37)
Albumin: 4.2 g/dL (ref 3.5–5.2)
Alkaline Phosphatase: 54 U/L (ref 39–117)
BUN: 12 mg/dL (ref 6–23)
CO2: 26 meq/L (ref 19–32)
Calcium: 8.7 mg/dL (ref 8.4–10.5)
Chloride: 105 meq/L (ref 96–112)
Creat: 0.85 mg/dL (ref 0.50–1.10)
GFR, Est African American: >89 mL/min
GFR, Est Non African American: 86 mL/min
Glucose, Bld: 91 mg/dL (ref 70–99)
Potassium: 3.9 meq/L (ref 3.5–5.3)
Sodium: 139 meq/L (ref 135–145)
Total Bilirubin: 0.9 mg/dL (ref 0.3–1.2)
Total Protein: 6.9 g/dL (ref 6.0–8.3)

## 2012-02-26 LAB — T4, FREE: Free T4: 0.99 ng/dL (ref 0.80–1.80)

## 2012-02-26 LAB — LIPID PANEL
Cholesterol: 138 mg/dL (ref 0–200)
HDL: 61 mg/dL
LDL Cholesterol: 69 mg/dL (ref 0–99)
Total CHOL/HDL Ratio: 2.3 ratio
Triglycerides: 42 mg/dL
VLDL: 8 mg/dL (ref 0–40)

## 2012-02-26 LAB — FOLLICLE STIMULATING HORMONE: FSH: 8 m[IU]/mL

## 2012-02-26 LAB — T3, FREE: T3, Free: 3.2 pg/mL (ref 2.3–4.2)

## 2012-02-26 NOTE — Progress Notes (Signed)
  Subjective:     Sarah Huerta is a 41 y.o. female and is here for a comprehensive physical exam. The patient reports problems - She has started to have hot flashes. Her mother went through menopause early and wonders if she its. Denies any vaginal dryness. Not tried anything to make better, Denies fatigue, feeling bad, fever...  History   Social History  . Marital Status: Married    Spouse Name: N/A    Number of Children: N/A  . Years of Education: N/A   Occupational History  . Not on file.   Social History Main Topics  . Smoking status: Former Smoker    Quit date: 01/26/1996  . Smokeless tobacco: Not on file  . Alcohol Use: No  . Drug Use: No  . Sexually Active:    Other Topics Concern  . Not on file   Social History Narrative  . No narrative on file   Health Maintenance  Topic Date Due  . Mammogram  07/06/1989  . Pap Smear  07/06/1989  . Tetanus/tdap  09/03/2016  . Influenza Vaccine  11/11/2011    The following portions of the patient's history were reviewed and updated as appropriate: allergies, current medications, past family history, past medical history, past social history, past surgical history and problem list.  Review of Systems A comprehensive review of systems was negative.   Objective:    BP 115/79  Pulse 83  Resp 17  Ht 5\' 4"  (1.626 m)  Wt 173 lb (78.472 kg)  BMI 29.70 kg/m2  SpO2 98% General appearance: alert, cooperative and appears stated age Head: Normocephalic, without obvious abnormality, atraumatic Eyes: conjunctivae/corneas clear. PERRL, EOM's intact. Fundi benign. Ears: normal TM's and external ear canals both ears Nose: Nares normal. Septum midline. Mucosa normal. No drainage or sinus tenderness. Throat: lips, mucosa, and tongue normal; teeth and gums normal Neck: no adenopathy, no carotid bruit, no JVD, supple, symmetrical, trachea midline and thyroid not enlarged, symmetric, no tenderness/mass/nodules Back: symmetric, no curvature. ROM  normal. No CVA tenderness. Lungs: clear to auscultation bilaterally Heart: regular rate and rhythm, S1, S2 normal, no murmur, click, rub or gallop Abdomen: soft, non-tender; bowel sounds normal; no masses,  no organomegaly Extremities: extremities normal, atraumatic, no cyanosis or edema Pulses: 2+ and symmetric Skin: Skin color, texture, turgor normal. No rashes or lesions Lymph nodes: Cervical, supraclavicular, and axillary nodes normal. Neurologic: Grossly normal    Assessment:    Healthy female exam.      Plan:    CPE- Is scheduled to get pap and mammogram in the next week. Declines flu shot. All other vaccines up to date. Will get fasting labs along with TSH panel to follow her hypothyroidism. She has listed in her problem list several things she never remembering having such as hyperlipidemia. I will update problem list after getting labs back. Will check FSH to see if trending up indicating early menopause. Reminder to have 4 servings of calcium or 500mg  BID and regular exercise. Follow up as needed. Will continue to routinly check TSH.  See After Visit Summary for Counseling Recommendations

## 2012-02-27 ENCOUNTER — Telehealth: Payer: Self-pay | Admitting: Physician Assistant

## 2012-02-27 ENCOUNTER — Telehealth: Payer: Self-pay

## 2012-02-27 NOTE — Telephone Encounter (Signed)
Pt aware.

## 2012-02-27 NOTE — Telephone Encounter (Signed)
Call pt: Just spoke with pharmacy. They are going to send recommendation this afternoon and once we approve pharmacy will give a call with the increase.

## 2012-02-27 NOTE — Telephone Encounter (Signed)
Call pt: I am waiting on [facility] to call back. Was supposed to discuss with them this am. I do not know increased dose at this time. I wanted to have discussion with pharmacist. TSH was elevated you will feel better when in normal range. I Will call [facility] over lunch and find out appropriate increase.

## 2012-02-27 NOTE — Telephone Encounter (Signed)
1- Sarah Huerta is concerned that the dose of the thyroid medication will be too much. Did you want her to increase her thyroid medication from 1.75 grain once daily to 1.75 grain bid?  2-She states the medication was not sent to [facility] in [facility].

## 2012-02-27 NOTE — Telephone Encounter (Signed)
Emi advised that we will call with her new dosing.

## 2012-02-28 ENCOUNTER — Other Ambulatory Visit: Payer: Self-pay | Admitting: Family Medicine

## 2012-02-28 MED ORDER — AMBULATORY NON FORMULARY MEDICATION: Status: DC

## 2012-03-20 ENCOUNTER — Ambulatory Visit: Payer: BC Managed Care – PPO | Admitting: Women's Health

## 2012-07-02 ENCOUNTER — Other Ambulatory Visit: Payer: Self-pay | Admitting: *Deleted

## 2012-07-02 MED ORDER — AMBULATORY NON FORMULARY MEDICATION: Status: DC

## 2013-01-08 ENCOUNTER — Telehealth: Payer: Self-pay | Admitting: *Deleted

## 2013-01-08 MED ORDER — AMBULATORY NON FORMULARY MEDICATION: Status: DC

## 2013-01-08 NOTE — Telephone Encounter (Signed)
Refill sent.Sarah Huerta  

## 2013-01-15 ENCOUNTER — Other Ambulatory Visit: Payer: Self-pay

## 2013-03-03 ENCOUNTER — Other Ambulatory Visit: Payer: Self-pay | Admitting: Obstetrics and Gynecology

## 2013-03-03 ENCOUNTER — Ambulatory Visit
Admission: RE | Admit: 2013-03-03 | Discharge: 2013-03-03 | Disposition: A | Discharge status: Home / Self Care | Payer: BC Managed Care – PPO | Source: Ambulatory Visit | Attending: Obstetrics and Gynecology | Admitting: Obstetrics and Gynecology

## 2013-03-03 DIAGNOSIS — N6310 Unspecified lump in the right breast, unspecified quadrant: Secondary | ICD-10-CM

## 2013-06-18 ENCOUNTER — Other Ambulatory Visit: Payer: Self-pay | Admitting: Family Medicine

## 2013-06-18 ENCOUNTER — Other Ambulatory Visit: Payer: Self-pay | Admitting: *Deleted

## 2013-06-18 DIAGNOSIS — E039 Hypothyroidism, unspecified: Secondary | ICD-10-CM

## 2013-06-18 DIAGNOSIS — Z0000 Encounter for general adult medical examination without abnormal findings: Secondary | ICD-10-CM

## 2013-06-19 LAB — COMPLETE METABOLIC PANEL WITH GFR
ALT: 24 U/L (ref 0–35)
AST: 18 U/L (ref 0–37)
Albumin: 4 g/dL (ref 3.5–5.2)
Alkaline Phosphatase: 58 U/L (ref 39–117)
BILIRUBIN TOTAL: 0.5 mg/dL (ref 0.2–1.2)
BUN: 19 mg/dL (ref 6–23)
CO2: 26 meq/L (ref 19–32)
CREATININE: 0.82 mg/dL (ref 0.50–1.10)
Calcium: 9 mg/dL (ref 8.4–10.5)
Chloride: 101 mEq/L (ref 96–112)
GFR, EST NON AFRICAN AMERICAN: 89 mL/min
GLUCOSE: 94 mg/dL (ref 70–99)
Potassium: 4.2 mEq/L (ref 3.5–5.3)
Sodium: 135 mEq/L (ref 135–145)
TOTAL PROTEIN: 7 g/dL (ref 6.0–8.3)

## 2013-06-19 LAB — CBC WITH DIFFERENTIAL/PLATELET
BASOS PCT: 1 % (ref 0–1)
Basophils Absolute: 0.1 10*3/uL (ref 0.0–0.1)
EOS ABS: 0.1 10*3/uL (ref 0.0–0.7)
Eosinophils Relative: 1 % (ref 0–5)
HCT: 41.6 % (ref 36.0–46.0)
Hemoglobin: 14.3 g/dL (ref 12.0–15.0)
LYMPHS ABS: 2 10*3/uL (ref 0.7–4.0)
Lymphocytes Relative: 25 % (ref 12–46)
MCH: 30.6 pg (ref 26.0–34.0)
MCHC: 34.4 g/dL (ref 30.0–36.0)
MCV: 88.9 fL (ref 78.0–100.0)
Monocytes Absolute: 0.6 10*3/uL (ref 0.1–1.0)
Monocytes Relative: 8 % (ref 3–12)
NEUTROS PCT: 65 % (ref 43–77)
Neutro Abs: 5.1 10*3/uL (ref 1.7–7.7)
PLATELETS: 389 10*3/uL (ref 150–400)
RBC: 4.68 MIL/uL (ref 3.87–5.11)
RDW: 13.7 % (ref 11.5–15.5)
WBC: 7.8 10*3/uL (ref 4.0–10.5)

## 2013-06-19 LAB — LIPID PANEL
CHOLESTEROL: 164 mg/dL (ref 0–200)
HDL: 60 mg/dL (ref >39)
LDL Cholesterol: 92 mg/dL (ref 0–99)
TRIGLYCERIDES: 58 mg/dL (ref <150)
Total CHOL/HDL Ratio: 2.7 Ratio
VLDL: 12 mg/dL (ref 0–40)

## 2013-06-20 LAB — TSH: TSH: 4.064 u[IU]/mL (ref 0.350–4.500)

## 2013-06-20 LAB — T3, FREE: T3, Free: 2.9 pg/mL (ref 2.3–4.2)

## 2013-06-20 LAB — T4, FREE: Free T4: 0.87 ng/dL (ref 0.80–1.80)

## 2013-06-22 ENCOUNTER — Encounter: Payer: Self-pay | Admitting: Family Medicine

## 2013-06-22 ENCOUNTER — Encounter: Payer: BC Managed Care – PPO | Admitting: Family Medicine

## 2013-06-22 ENCOUNTER — Ambulatory Visit (INDEPENDENT_AMBULATORY_CARE_PROVIDER_SITE_OTHER): Payer: BC Managed Care – PPO | Admitting: Family Medicine

## 2013-06-22 VITALS — BP 134/87 | HR 61 | Ht 64.0 in | Wt 184.0 lb

## 2013-06-22 DIAGNOSIS — E039 Hypothyroidism, unspecified: Secondary | ICD-10-CM

## 2013-06-22 DIAGNOSIS — Z0000 Encounter for general adult medical examination without abnormal findings: Secondary | ICD-10-CM

## 2013-06-22 LAB — THYROID ANTIBODIES
THYROID PEROXIDASE ANTIBODY: 26.8 [IU]/mL (ref <35.0)
Thyroglobulin Ab: <20 U/mL (ref <40.0)

## 2013-06-22 LAB — FERRITIN: Ferritin: 51 ng/mL (ref 10–291)

## 2013-06-22 LAB — VITAMIN B12: VITAMIN B 12: 468 pg/mL (ref 211–911)

## 2013-06-22 MED ORDER — THYROID 130 MG PO TABS: 130.0000 mg | ORAL_TABLET | Freq: Every day | ORAL | Status: DC

## 2013-06-22 NOTE — Progress Notes (Signed)
Spoke w/Tiffany @ [facility] and asked her to add on ferritin, B12, and AM cortisol to [id number].Deno Etienneonya L Kiyani Jernigan

## 2013-06-22 NOTE — Patient Instructions (Addendum)
Recheck labs in 4-6 weeks. Marland Kitchen. Keep up a regular exercise program and make sure you are eating a healthy diet Try to eat 4 servings of dairy a day, or if you are lactose intolerant take a calcium with vitamin D daily.  Your vaccines are up to date.

## 2013-06-22 NOTE — Progress Notes (Signed)
Subjective:     Sarah Huerta is a 43 y.o. female and is here for a comprehensive physical exam. The patient reports problems - just doesn' feel well. Has been working out but can't lose weight.  on compounded thyroid hormone. She's having palpitations especially with exercise. In fact sometimes actually stops her from exercising until is was down and she has to start back again. She's also very concerned about weight gain. She's not currently having her calories but she doesn't like she copy. She's interested in trying to change the Armour thyroid and see if she feels better.She has felt more fatigued lately. She has been having sweats as well.   History   Social History  . Marital Status: Married    Spouse Name: N/A    Number of Children: N/A  . Years of Education: N/A   Occupational History  . Not on file.   Social History Main Topics  . Smoking status: Former Smoker    Quit date: 01/26/1996  . Smokeless tobacco: Not on file  . Alcohol Use: No  . Drug Use: No  . Sexual Activity:    Other Topics Concern  . Not on file   Social History Narrative  . No narrative on file   Health Maintenance  Topic Date Due  . Influenza Vaccine  10/10/2013  . Mammogram  03/03/2014  . Pap Smear  02/10/2015  . Tetanus/tdap  09/03/2016    The following portions of the patient's history were reviewed and updated as appropriate: allergies, current medications, past family history, past medical history, past social history, past surgical history and problem list.  Review of Systems A comprehensive review of systems was negative.   Objective:    BP 134/87  Pulse 61  Ht 5\' 4"  (1.626 m)  Wt 184 lb (83.462 kg)  BMI 31.57 kg/m2 General appearance: alert, cooperative and appears stated age Head: Normocephalic, without obvious abnormality, atraumatic Eyes: conj clear, EOMi, PEERLA Ears: normal TM's and external ear canals both ears Nose: Nares normal. Septum midline. Mucosa normal. No  drainage or sinus tenderness. Throat: lips, mucosa, and tongue normal; teeth and gums normal Neck: no adenopathy, no carotid bruit, no JVD, supple, symmetrical, trachea midline and thyroid not enlarged, symmetric, no tenderness/mass/nodules Back: symmetric, no curvature. ROM normal. No CVA tenderness. Lungs: clear to auscultation bilaterally Heart: regular rate and rhythm, S1, S2 normal, no murmur, click, rub or gallop Abdomen: soft, non-tender; bowel sounds normal; no masses,  no organomegaly Extremities: extremities normal, atraumatic, no cyanosis or edema Pulses: 2+ and symmetric Skin: Skin color, texture, turgor normal. No rashes or lesions Lymph nodes: Cervical, supraclavicular, and axillary nodes normal. Neurologic: Alert and oriented X 3, normal strength and tone. Normal symmetric reflexes. Normal coordination and gait    Assessment:    Healthy female exam.      Plan:     See After Visit Summary for Counseling Recommendations  Keep up a regular exercise program and make sure you are eating a healthy diet Try to eat 4 servings of dairy a day, or if you are lactose intolerant take a calcium with vitamin D daily.  Your vaccines are up to date.   Hypothyroidism-she's actually having some symptoms that are consistent with hyperthyroidism such as palpitations and sweating. Also seems hypothyroid and that she is gaining weight even while exercising et Karie Sodacetera. At this point we'll try switching her to Armour thyroid and see how she does on it. We'll need to monitor for increase in  hyperthyroid symptoms. We'll recheck lab work in 4-6 weeks. Lab slip printed today.

## 2013-06-23 LAB — T3, REVERSE: T3 REVERSE: 10 ng/dL (ref 8–25)

## 2013-06-23 LAB — CORTISOL-AM, BLOOD: Cortisol - AM: 16.9 ug/dL (ref 4.3–22.4)

## 2013-06-24 ENCOUNTER — Other Ambulatory Visit: Payer: Self-pay | Admitting: Family Medicine

## 2013-06-24 MED ORDER — THYROID 65 MG PO TABS: 65.0000 mg | ORAL_TABLET | Freq: Two times a day (BID) | ORAL | Status: DC

## 2013-07-22 ENCOUNTER — Other Ambulatory Visit: Payer: Self-pay

## 2013-07-22 MED ORDER — THYROID 60 MG PO TABS: 60.0000 mg | ORAL_TABLET | Freq: Every day | ORAL | Status: AC

## 2013-07-22 NOTE — Telephone Encounter (Signed)
Sent Armour Thyroid to pharmacy.

## 2013-12-25 ENCOUNTER — Other Ambulatory Visit: Payer: Self-pay

## 2015-02-22 ENCOUNTER — Encounter: Payer: Self-pay | Admitting: Emergency Medicine

## 2015-02-22 ENCOUNTER — Emergency Department
Admission: EM | Admit: 2015-02-22 | Discharge: 2015-02-22 | Disposition: A | Discharge status: Home / Self Care | Payer: BLUE CROSS/BLUE SHIELD | Source: Home / Self Care | Attending: Family Medicine | Admitting: Family Medicine

## 2015-02-22 DIAGNOSIS — H6502 Acute serous otitis media, left ear: Secondary | ICD-10-CM | POA: Diagnosis not present

## 2015-02-22 DIAGNOSIS — R05 Cough: Secondary | ICD-10-CM | POA: Diagnosis not present

## 2015-02-22 DIAGNOSIS — R059 Cough, unspecified: Secondary | ICD-10-CM

## 2015-02-22 DIAGNOSIS — J029 Acute pharyngitis, unspecified: Secondary | ICD-10-CM | POA: Diagnosis not present

## 2015-02-22 LAB — POCT RAPID STREP A (OFFICE): Rapid Strep A Screen: NEGATIVE

## 2015-02-22 MED ORDER — AMOXICILLIN 500 MG PO CAPS: 500.0000 mg | ORAL_CAPSULE | Freq: Two times a day (BID) | ORAL | Status: DC

## 2015-02-22 MED ORDER — BENZONATATE 100 MG PO CAPS: 100.0000 mg | ORAL_CAPSULE | Freq: Three times a day (TID) | ORAL | Status: DC

## 2015-02-22 NOTE — Discharge Instructions (Signed)
You may take 400-600mg Ibuprofen (Motrin) every 6-8 hours for fever and pain  °Alternate with Tylenol  °You may take 500mg Tylenol every 4-6 hours as needed for fever and pain  °Follow-up with your primary care provider next week for recheck of symptoms if not improving.  °Be sure to drink plenty of fluids and rest, at least 8hrs of sleep a night, preferably more while you are sick. °Return urgent care or go to closest ER if you cannot keep down fluids/signs of dehydration, fever not reducing with Tylenol, difficulty breathing/wheezing, stiff neck, worsening condition, or other concerns (see below)  °Please take antibiotics as prescribed and be sure to complete entire course even if you start to feel better to ensure infection does not come back. ° ° °Cool Mist Vaporizers °Vaporizers may help relieve the symptoms of a cough and cold. They add moisture to the air, which helps mucus to become thinner and less sticky. This makes it easier to breathe and cough up secretions. Cool mist vaporizers do not cause serious burns like hot mist vaporizers, which may also be called steamers or humidifiers. Vaporizers have not been proven to help with colds. You should not use a vaporizer if you are allergic to mold. °HOME CARE INSTRUCTIONS °· Follow the package instructions for the vaporizer. °· Do not use anything other than distilled water in the vaporizer. °· Do not run the vaporizer all of the time. This can cause mold or bacteria to grow in the vaporizer. °· Clean the vaporizer after each time it is used. °· Clean and dry the vaporizer well before storing it. °· Stop using the vaporizer if worsening respiratory symptoms develop. °  °This information is not intended to replace advice given to you by your health care provider. Make sure you discuss any questions you have with your health care provider. °  °Document Released: 11/24/2003 Document Revised: 03/03/2013 Document Reviewed: 07/16/2012 °Elsevier Interactive Patient  Education ©2016 Elsevier Inc. ° °

## 2015-02-22 NOTE — ED Notes (Signed)
Sore throat, swollen red tonsils, stuffy ears, congestion, cough, low grade fever x 6 days

## 2015-02-22 NOTE — ED Provider Notes (Signed)
CSN: [id number]     Arrival date & time 02/22/15  0903 History   First MD Initiated Contact with Patient 02/22/15 [phone]     Chief Complaint  Patient presents with  . Sore Throat   (Consider location/radiation/quality/duration/timing/severity/associated sxs/prior Treatment) HPI  Pt is a 44yo female presenting to [facility] with c/o worsening sore throat with swollen red tonsils, stuffy ears, nasal congestion, mild intermittent productive cough that is worse at night and a low grade fever for 6 days.  Pt states the Left side of her neck and Left ear are most bothersome for her.  Pain is aching and throbbing, 5/10 at worst. She has tried multiple OTC medications without relief. No known sick contacts or recent travel. Denies n/v/d. Pt states it hurts to drink but denies difficulty keeping down fluids or breathing.  Past Medical History  Diagnosis Date  . Thyroid disease    Past Surgical History  Procedure Laterality Date  . Cesarean section     History reviewed. No pertinent family history. Social History  Substance Use Topics  . Smoking status: Former Smoker    Quit date: 01/26/1996  . Smokeless tobacco: None  . Alcohol Use: No   OB History    No data available     Review of Systems  Constitutional: Positive for fever, chills, appetite change and fatigue.  HENT: Positive for congestion, ear pain ( Left) and sore throat. Negative for trouble swallowing and voice change.   Respiratory: Positive for cough. Negative for shortness of breath.   Cardiovascular: Negative for chest pain and palpitations.  Gastrointestinal: Negative for nausea, vomiting, abdominal pain and diarrhea.  Musculoskeletal: Negative for myalgias, back pain and arthralgias.  Skin: Negative for rash.  Neurological: Positive for headaches. Negative for dizziness and light-headedness.  All other systems reviewed and are negative.   Allergies  Review of patient's allergies indicates no known allergies.  Home  Medications   Prior to Admission medications   Medication Sig Start Date End Date Taking? Authorizing Provider  ibuprofen (ADVIL,MOTRIN) 200 MG tablet Take 200 mg by mouth every 6 (six) hours as needed.   Yes Historical Provider, MD  oxymetazoline (AFRIN) 0.05 % nasal spray Place 1 spray into both nostrils 2 (two) times daily.   Yes Historical Provider, MD  Phenyleph-Doxylamine-DM-APAP (ALKA-SELTZER PLS NIGHT CLD/FLU PO) Take by mouth.   Yes Historical Provider, MD  Throat Lozenges (COUGH DROPS MENTHOL MT) Use as directed in the mouth or throat.   Yes Historical Provider, MD  AMBULATORY NON FORMULARY MEDICATION Medication Name: thyroid 1 grain SR Capsules PO BID 01/08/13   Agapito Games, MD  amoxicillin (AMOXIL) 500 MG capsule Take 1 capsule (500 mg total) by mouth 2 (two) times daily. For 10 days 02/22/15   Junius Finner, PA-C  benzonatate (TESSALON) 100 MG capsule Take 1 capsule (100 mg total) by mouth every 8 (eight) hours. 02/22/15   Junius Finner, PA-C  thyroid [facility] THYROID) 60 MG tablet Take 1 tablet (60 mg total) by mouth daily before breakfast. 07/22/13   Agapito Games, MD   Meds Ordered and Administered this Visit  Medications - No data to display  BP 121/88 mmHg  Pulse 66  Temp(Src) 98.2 F (36.8 C) (Oral)  Ht  (1.575 m)  Wt 170 lb (77.111 kg)  BMI 31.09 kg/m2  SpO2 98%  LMP 02/22/2015 No data found.   Physical Exam  Constitutional: She appears well-developed and well-nourished. No distress.  HENT:  Head: Normocephalic and atraumatic.  Right Ear: Hearing, tympanic membrane, external ear and ear canal normal.  Left Ear: Hearing, external ear and ear canal normal. Tympanic membrane is erythematous and bulging. A middle ear effusion is present.  Nose: Mucosal edema present. Right sinus exhibits no maxillary sinus tenderness and no frontal sinus tenderness. Left sinus exhibits no maxillary sinus tenderness and no frontal sinus tenderness.  Mouth/Throat:  Uvula is midline and mucous membranes are normal. Posterior oropharyngeal edema and posterior oropharyngeal erythema present. No oropharyngeal exudate or tonsillar abscesses.  Eyes: Conjunctivae are normal. No scleral icterus.  Neck: Normal range of motion. Neck supple.  Cardiovascular: Normal rate, regular rhythm and normal heart sounds.   Pulmonary/Chest: Effort normal and breath sounds normal. No respiratory distress. She has no wheezes. She has no rales. She exhibits no tenderness.  Abdominal: Soft. She exhibits no distension and no mass. There is no tenderness. There is no rebound and no guarding.  Musculoskeletal: Normal range of motion.  Lymphadenopathy:    She has cervical adenopathy.  Neurological: She is alert.  Skin: Skin is warm and dry. She is not diaphoretic.  Nursing note and vitals reviewed.   ED Course  Procedures (including critical care time)  Labs Review Labs Reviewed  STREP A DNA PROBE  POCT RAPID STREP A (OFFICE)    Imaging Review No results found.   MDM   1. Acute pharyngitis, unspecified etiology   2. Acute serous otitis media of left ear, recurrence not specified   3. Cough    Pt c/o URI symptoms with sore throat, congestion, cough, and left ear pain. No evidence of peritonsillar abscess. Left TM c/w AOM  Rapid strep: negative  Rx: Amoxicillin and Tessalon.    Advised pt to use acetaminophen and ibuprofen as needed for fever and pain. Encouraged rest and fluids. F/u with PCP in 7-10 days if not improving, sooner if worsening. Pt verbalized understanding and agreement with tx plan.     Junius Finnerrin O'Malley, PA-C 02/22/15 1100

## 2015-02-23 LAB — STREP A DNA PROBE: GASP: NOT DETECTED

## 2015-02-24 ENCOUNTER — Telehealth: Payer: Self-pay | Admitting: *Deleted

## 2015-04-30 ENCOUNTER — Emergency Department
Admission: EM | Admit: 2015-04-30 | Discharge: 2015-04-30 | Disposition: A | Discharge status: Home / Self Care | Payer: BLUE CROSS/BLUE SHIELD | Source: Home / Self Care | Attending: Family Medicine | Admitting: Family Medicine

## 2015-04-30 ENCOUNTER — Encounter: Payer: Self-pay | Admitting: Emergency Medicine

## 2015-04-30 DIAGNOSIS — J101 Influenza due to other identified influenza virus with other respiratory manifestations: Secondary | ICD-10-CM | POA: Diagnosis not present

## 2015-04-30 LAB — POCT INFLUENZA A/B
Influenza A, POC: NEGATIVE
Influenza B, POC: POSITIVE — AB

## 2015-04-30 MED ORDER — GUAIFENESIN-CODEINE 100-10 MG/5ML PO SOLN: ORAL | Status: DC

## 2015-04-30 MED ORDER — OSELTAMIVIR PHOSPHATE 75 MG PO CAPS: 75.0000 mg | ORAL_CAPSULE | Freq: Two times a day (BID) | ORAL | Status: DC

## 2015-04-30 NOTE — Discharge Instructions (Signed)
Take plain guaifenesin (  extended release tabs such as Mucinex) twice daily, with plenty of water, for cough and congestion.  Get adequate rest.   May use Afrin nasal spray (or generic oxymetazoline) twice daily for about 5 days and then discontinue.  Also recommend using saline nasal spray several times daily and saline nasal irrigation (AYR is a common brand).  Use Flonase nasal spray each morning after using Afrin nasal spray and saline nasal irrigation. Try warm salt water gargles for sore throat.  Stop all antihistamines for now, and other non-prescription cough/cold preparations. May take Ibuprofen , 4 tabs every 8 hours with food for fever, body aches, etc.   Influenza, Adult Influenza ("the flu") is a viral infection of the respiratory tract. It occurs more often in winter months because people spend more time in close contact with one another. Influenza can make you feel very sick. Influenza easily spreads from person to person (contagious). CAUSES  Influenza is caused by a virus that infects the respiratory tract. You can catch the virus by breathing in droplets from an infected person's cough or sneeze. You can also catch the virus by touching something that was recently contaminated with the virus and then touching your mouth, nose, or eyes. RISKS AND COMPLICATIONS You may be at risk for a more severe case of influenza if you smoke cigarettes, have diabetes, have chronic heart disease (such as heart failure) or lung disease (such as asthma), or if you have a weakened immune system. Elderly people and pregnant women are also at risk for more serious infections. The most common problem of influenza is a lung infection (pneumonia). Sometimes, this problem can require emergency medical care and may be life threatening. SIGNS AND SYMPTOMS  Symptoms typically last 4 to 10 days and may include:  Fever.  Chills.  Headache, body aches, and muscle aches.  Sore throat.  Chest  discomfort and cough.  Poor appetite.  Weakness or feeling tired.  Dizziness.  Nausea or vomiting. DIAGNOSIS  Diagnosis of influenza is often made based on your history and a physical exam. A nose or throat swab test can be done to confirm the diagnosis. TREATMENT  In mild cases, influenza goes away on its own. Treatment is directed at relieving symptoms. For more severe cases, your health care provider may prescribe antiviral medicines to shorten the sickness. Antibiotic medicines are not effective because the infection is caused by a virus, not by bacteria. HOME CARE INSTRUCTIONS  Take medicines only as directed by your health care provider.  Use a cool mist humidifier to make breathing easier.  Get plenty of rest until your temperature returns to normal. This usually takes 3 to 4 days.  Drink enough fluid to keep your urine clear or pale yellow.  Cover yourmouth and nosewhen coughing or sneezing,and wash your handswellto prevent thevirusfrom spreading.  Stay homefromwork orschool untilthe fever is gonefor at least 78full day. PREVENTION  An annual influenza vaccination (flu shot) is the best way to avoid getting influenza. An annual flu shot is now routinely recommended for all adults in the [place] SEEK MEDICAL CARE IF:  You experiencechest pain, yourcough worsens,or you producemore mucus.  Youhave nausea,vomiting, ordiarrhea.  Your fever returns or gets worse. SEEK IMMEDIATE MEDICAL CARE IF:  You havetrouble breathing, you become short of breath,or your skin ornails becomebluish.  You have severe painor stiffnessin the neck.  You develop a sudden headache, or pain in the face or ear.  You have nausea or vomiting that  you cannot control. MAKE SURE YOU:   Understand these instructions.  Will watch your condition.  Will get help right away if you are not doing well or get worse.   This information is not intended to replace advice given to  you by your health care provider. Make sure you discuss any questions you have with your health care provider.   Document Released: 02/24/2000 Document Revised: 03/19/2014 Document Reviewed: 05/28/2011 Elsevier Interactive Patient Education [employer]

## 2015-04-30 NOTE — ED Notes (Signed)
Patient reports 3 days of progressive worsening cough with associated chest discomfort, hoarseness, fever and aches. Took ibuprofen at 1000 today.

## 2015-04-30 NOTE — ED Provider Notes (Signed)
CSN: [id number]     Arrival date & time 04/30/15  1133 History   First MD Initiated Contact with Patient 04/30/15 1238     Chief Complaint  Patient presents with  . Cough  . Nasal Congestion  . Generalized Body Aches  . Fever  . Hoarse      HPI Comments: Complains of 3 day history flu-like illness including myalgias, headache, fever/chills, fatigue, and cough.  Also has mild nasal congestion and sore throat.  Cough is non-productive and somewhat worse at night.  No pleuritic pain.  She has not had a flu shot this season.    The history is provided by the patient.    Past Medical History  Diagnosis Date  . Thyroid disease    Past Surgical History  Procedure Laterality Date  . Cesarean section     Family History  Problem Relation Age of Onset  . Diabetes Mother   . Hypertension Mother   . Thyroid disease Mother    Social History  Substance Use Topics  . Smoking status: Former Smoker    Quit date: 01/26/1996  . Smokeless tobacco: None  . Alcohol Use: No   OB History    No data available     Review of Systems + sore throat + hoarse + cough No pleuritic pain No wheezing + nasal congestion + post-nasal drainage No sinus pain/pressure No itchy/red eyes No earache No hemoptysis + SOB + fever, + chills No nausea No vomiting No abdominal pain No diarrhea No urinary symptoms No skin rash + fatigue + myalgias + headache Used OTC meds without relief  Allergies  Review of patient's allergies indicates no known allergies.  Home Medications   Prior to Admission medications   Medication Sig Start Date End Date Taking? Authorizing Provider  AMBULATORY NON FORMULARY MEDICATION Medication Name: thyroid 1 grain SR Capsules PO BID 01/08/13   Agapito Games, MD  amoxicillin (AMOXIL) 500 MG capsule Take 1 capsule (500 mg total) by mouth 2 (two) times daily. For 10 days 02/22/15   Junius Finner, PA-C  benzonatate (TESSALON) 100 MG capsule Take 1 capsule (100 mg  total) by mouth every 8 (eight) hours. 02/22/15   Junius Finner, PA-C  guaiFENesin-codeine 100-10 MG/5ML syrup Take 10mL by mouth at bedtime as needed for cough 04/30/15   Lattie Haw, MD  ibuprofen (ADVIL,MOTRIN) 200 MG tablet Take 200 mg by mouth every 6 (six) hours as needed.    Historical Provider, MD  oseltamivir (TAMIFLU) 75 MG capsule Take 1 capsule (75 mg total) by mouth every 12 (twelve) hours. 04/30/15   Lattie Haw, MD  oxymetazoline (AFRIN) 0.05 % nasal spray Place 1 spray into both nostrils 2 (two) times daily.    Historical Provider, MD  Phenyleph-Doxylamine-DM-APAP (ALKA-SELTZER PLS NIGHT CLD/FLU PO) Take by mouth.    Historical Provider, MD  Throat Lozenges (COUGH DROPS MENTHOL MT) Use as directed in the mouth or throat.    Historical Provider, MD  thyroid (ARMOUR THYROID) 60 MG tablet Take 1 tablet (60 mg total) by mouth daily before breakfast. 07/22/13   Agapito Games, MD   Meds Ordered and Administered this Visit  Medications - No data to display  BP 116/84 mmHg  Pulse 109  Temp(Src) 98.2 F (36.8 C) (Oral)  Resp 16  Ht  (1.626 m)  Wt 170 lb (77.111 kg)  BMI 29.17 kg/m2  SpO2 100%  LMP 04/20/2015 (Exact Date) No data found.   Physical Exam Nursing  notes and Vital Signs reviewed. Appearance:  Patient appears stated age, and in no acute distress Eyes:  Pupils are equal, round, and reactive to light and accomodation.  Extraocular movement is intact.  Conjunctivae are not inflamed  Ears:  Canals normal.  Tympanic membranes normal.  Nose:  Mildly congested turbinates.  No sinus tenderness.   Pharynx:  Normal Neck:  Supple.  Tender enlarged posterior nodes are palpated bilaterally  Lungs:  Clear to auscultation.  Breath sounds are equal.  Moving air well. Heart:  Regular rate and rhythm without murmurs, rubs, or gallops.  Abdomen:  Nontender without masses or hepatosplenomegaly.  Bowel sounds are present.  No CVA or flank tenderness.  Extremities:  No  edema.  Skin:  No rash present.   ED Course  Procedures none    Labs Reviewed  POCT INFLUENZA A/B - Abnormal; Notable for the following:    Influenza B, POC Positive (*)    All other components within normal limits     MDM   1. Influenza B    Begin Tamiflu.   Rx for Robitussin AC for night time cough.  Will treat husband prophylactically Take plain guaifenesin (  extended release tabs such as Mucinex) twice daily, with plenty of water, for cough and congestion.  Get adequate rest.   May use Afrin nasal spray (or generic oxymetazoline) twice daily for about 5 days and then discontinue.  Also recommend using saline nasal spray several times daily and saline nasal irrigation (AYR is a common brand).  Use Flonase nasal spray each morning after using Afrin nasal spray and saline nasal irrigation. Try warm salt water gargles for sore throat.  Stop all antihistamines for now, and other non-prescription cough/cold preparations. May take Ibuprofen , 4 tabs every 8 hours with food for fever, body aches, etc. Followup with Family Doctor if not improved in about 4 to 5 days.    Lattie Haw, MD 05/03/15 1046

## 2015-06-11 ENCOUNTER — Encounter: Payer: Self-pay | Admitting: Emergency Medicine

## 2015-06-11 ENCOUNTER — Emergency Department
Admission: EM | Admit: 2015-06-11 | Discharge: 2015-06-11 | Disposition: A | Discharge status: Home / Self Care | Payer: BLUE CROSS/BLUE SHIELD | Source: Home / Self Care | Attending: Family Medicine | Admitting: Family Medicine

## 2015-06-11 ENCOUNTER — Emergency Department (INDEPENDENT_AMBULATORY_CARE_PROVIDER_SITE_OTHER): Payer: BLUE CROSS/BLUE SHIELD

## 2015-06-11 DIAGNOSIS — M533 Sacrococcygeal disorders, not elsewhere classified: Secondary | ICD-10-CM

## 2015-06-11 LAB — POCT URINE PREGNANCY: PREG TEST UR: NEGATIVE

## 2015-06-11 MED ORDER — MELOXICAM 15 MG PO TABS: 15.0000 mg | ORAL_TABLET | Freq: Every day | ORAL | Status: AC

## 2015-06-11 NOTE — ED Notes (Signed)
Pt c/o possible fractured tailbone, pain x 2 weeks, no apparent injury and hard to lift left leg.

## 2015-06-11 NOTE — Discharge Instructions (Signed)
Apply ice pack for 20 to 30 minutes, 3 to 4 times daily  Continue until pain decreases.  Use a "doughnut" pad for sitting.

## 2015-06-11 NOTE — ED Provider Notes (Signed)
CSN: [id number]     Arrival date & time 06/11/15  4098 History   First MD Initiated Contact with Patient 06/11/15 [phone]     Chief Complaint  Patient presents with  . Tailbone Pain      HPI Comments: Fifteen days ago patient awoke with tailbone pain.  She recalls no trauma or injury.  The pain is worse when sitting and better standing.  She feels well otherwise.  The history is provided by the patient and the spouse.    Past Medical History  Diagnosis Date  . Thyroid disease    Past Surgical History  Procedure Laterality Date  . Cesarean section     Family History  Problem Relation Age of Onset  . Diabetes Mother   . Hypertension Mother   . Thyroid disease Mother    Social History  Substance Use Topics  . Smoking status: Former Smoker    Quit date: 01/26/1996  . Smokeless tobacco: None  . Alcohol Use: No   OB History    No data available     Review of Systems  Constitutional: Negative for fever, chills, diaphoresis and fatigue.  Skin: Negative for color change, rash and wound.  All other systems reviewed and are negative.   Allergies  Review of patient's allergies indicates no known allergies.  Home Medications   Prior to Admission medications   Medication Sig Start Date End Date Taking? Authorizing Provider  meloxicam (MOBIC) 15 MG tablet Take 1 tablet (15 mg total) by mouth daily. Take with food each morning 06/11/15   Lattie Haw, MD  thyroid [facility] THYROID) 60 MG tablet Take 1 tablet (60 mg total) by mouth daily before breakfast. 07/22/13   Agapito Games, MD   Meds Ordered and Administered this Visit  Medications - No data to display  BP 123/86 mmHg  Pulse 85  Temp(Src) 98.1 F (36.7 C) (Oral)  Ht  (1.626 m)  Wt 170 lb (77.111 kg)  BMI 29.17 kg/m2  SpO2 97%  LMP 05/18/2015 No data found.   Physical Exam  Constitutional: She is oriented to person, place, and time. She appears well-developed and well-nourished. No distress.  HENT:    Head: Normocephalic.  Eyes: Pupils are equal, round, and reactive to light.  Neck: Neck supple.  Cardiovascular: Normal heart sounds.   Pulmonary/Chest: Breath sounds normal.  Abdominal: Bowel sounds are normal. There is no tenderness.  Musculoskeletal: Normal range of motion. She exhibits no edema.       Legs: There is distinct tenderness to palpation over superior aspect of coccyx.  No evidence pilonidal cyst.  Lymphadenopathy:    She has no cervical adenopathy.  Neurological: She is alert and oriented to person, place, and time.  Skin: Skin is warm and dry. No rash noted.  Nursing note and vitals reviewed.   ED Course  Procedures  None    Labs Reviewed  POCT URINE PREGNANCY negative    Imaging Review Dg Sacrum/coccyx  06/11/2015  CLINICAL DATA:  Tail bone pain radiating to LEFT for 2 weeks, no known injury, awoke 15 days ago with coccygeal pain that has persisted EXAM: SACRUM AND COCCYX - 2+ VIEW COMPARISON:  Non FINDINGS: SI joints symmetric and preserved. Osseous mineralization normal. Symmetric sacral neural foramina. No sacrococcygeal fracture or bone destruction. IMPRESSION: No acute abnormalities. Electronically Signed   By: Ulyses Southward M.D.   On: 06/11/2015 11:02     MDM   1. Coccydynia    Begin Mobic  15mg  daily. Apply ice pack for 20 to 30 minutes, 3 to 4 times daily  Continue until pain decreases.  Use a "doughnut" pad for sitting. Followup with Dr. Rodney Langtonhomas Thekkekandam or Dr. Clementeen GrahamEvan Corey ([facility]) if not improving about two weeks.     Lattie HawStephen A Yasmen Cortner, MD 06/13/15 [phone]

## 2015-07-12 DIAGNOSIS — F419 Anxiety disorder, unspecified: Secondary | ICD-10-CM | POA: Diagnosis not present

## 2015-07-12 DIAGNOSIS — E039 Hypothyroidism, unspecified: Secondary | ICD-10-CM | POA: Diagnosis not present

## 2015-07-12 DIAGNOSIS — F320 Major depressive disorder, single episode, mild: Secondary | ICD-10-CM | POA: Diagnosis not present

## 2015-07-12 DIAGNOSIS — R790 Abnormal level of blood mineral: Secondary | ICD-10-CM | POA: Diagnosis not present

## 2015-08-09 DIAGNOSIS — F419 Anxiety disorder, unspecified: Secondary | ICD-10-CM | POA: Diagnosis not present

## 2015-08-09 DIAGNOSIS — E039 Hypothyroidism, unspecified: Secondary | ICD-10-CM | POA: Diagnosis not present

## 2015-09-16 DIAGNOSIS — E039 Hypothyroidism, unspecified: Secondary | ICD-10-CM | POA: Diagnosis not present

## 2015-10-17 DIAGNOSIS — F419 Anxiety disorder, unspecified: Secondary | ICD-10-CM | POA: Diagnosis not present

## 2015-10-17 DIAGNOSIS — E039 Hypothyroidism, unspecified: Secondary | ICD-10-CM | POA: Diagnosis not present

## 2015-11-03 DIAGNOSIS — E039 Hypothyroidism, unspecified: Secondary | ICD-10-CM | POA: Diagnosis not present

## 2015-11-03 DIAGNOSIS — Z23 Encounter for immunization: Secondary | ICD-10-CM | POA: Diagnosis not present

## 2015-12-28 DIAGNOSIS — E039 Hypothyroidism, unspecified: Secondary | ICD-10-CM | POA: Diagnosis not present

## 2016-01-13 DIAGNOSIS — H52221 Regular astigmatism, right eye: Secondary | ICD-10-CM | POA: Diagnosis not present

## 2016-01-13 DIAGNOSIS — H5210 Myopia, unspecified eye: Secondary | ICD-10-CM | POA: Diagnosis not present

## 2016-04-06 DIAGNOSIS — Z6824 Body mass index (BMI) 24.0-24.9, adult: Secondary | ICD-10-CM | POA: Diagnosis not present

## 2016-04-06 DIAGNOSIS — Z1231 Encounter for screening mammogram for malignant neoplasm of breast: Secondary | ICD-10-CM | POA: Diagnosis not present

## 2016-04-06 DIAGNOSIS — Z01419 Encounter for gynecological examination (general) (routine) without abnormal findings: Secondary | ICD-10-CM | POA: Diagnosis not present

## 2016-04-18 DIAGNOSIS — F419 Anxiety disorder, unspecified: Secondary | ICD-10-CM | POA: Diagnosis not present

## 2016-06-28 DIAGNOSIS — E039 Hypothyroidism, unspecified: Secondary | ICD-10-CM | POA: Diagnosis not present

## 2016-08-02 DIAGNOSIS — Z13220 Encounter for screening for lipoid disorders: Secondary | ICD-10-CM | POA: Diagnosis not present

## 2016-08-02 DIAGNOSIS — Z0000 Encounter for general adult medical examination without abnormal findings: Secondary | ICD-10-CM | POA: Diagnosis not present

## 2016-08-21 DIAGNOSIS — L237 Allergic contact dermatitis due to plants, except food: Secondary | ICD-10-CM | POA: Diagnosis not present

## 2016-09-05 DIAGNOSIS — L309 Dermatitis, unspecified: Secondary | ICD-10-CM | POA: Diagnosis not present

## 2016-12-12 DIAGNOSIS — H5213 Myopia, bilateral: Secondary | ICD-10-CM | POA: Diagnosis not present

## 2017-03-19 DIAGNOSIS — F419 Anxiety disorder, unspecified: Secondary | ICD-10-CM | POA: Diagnosis not present

## 2017-03-19 DIAGNOSIS — Z23 Encounter for immunization: Secondary | ICD-10-CM | POA: Diagnosis not present

## 2017-03-19 DIAGNOSIS — E039 Hypothyroidism, unspecified: Secondary | ICD-10-CM | POA: Diagnosis not present

## 2017-05-06 DIAGNOSIS — Z1231 Encounter for screening mammogram for malignant neoplasm of breast: Secondary | ICD-10-CM | POA: Diagnosis not present

## 2017-05-06 DIAGNOSIS — Z01419 Encounter for gynecological examination (general) (routine) without abnormal findings: Secondary | ICD-10-CM | POA: Diagnosis not present

## 2017-05-06 DIAGNOSIS — Z6827 Body mass index (BMI) 27.0-27.9, adult: Secondary | ICD-10-CM | POA: Diagnosis not present

## 2017-06-18 DIAGNOSIS — F419 Anxiety disorder, unspecified: Secondary | ICD-10-CM | POA: Diagnosis not present

## 2017-08-09 DIAGNOSIS — H5213 Myopia, bilateral: Secondary | ICD-10-CM | POA: Diagnosis not present

## 2017-08-28 DIAGNOSIS — F419 Anxiety disorder, unspecified: Secondary | ICD-10-CM | POA: Diagnosis not present

## 2017-08-28 DIAGNOSIS — F320 Major depressive disorder, single episode, mild: Secondary | ICD-10-CM | POA: Diagnosis not present

## 2017-09-17 DIAGNOSIS — F320 Major depressive disorder, single episode, mild: Secondary | ICD-10-CM | POA: Diagnosis not present

## 2017-09-17 DIAGNOSIS — F419 Anxiety disorder, unspecified: Secondary | ICD-10-CM | POA: Diagnosis not present

## 2017-09-17 DIAGNOSIS — J029 Acute pharyngitis, unspecified: Secondary | ICD-10-CM | POA: Diagnosis not present

## 2017-09-27 DIAGNOSIS — J0100 Acute maxillary sinusitis, unspecified: Secondary | ICD-10-CM | POA: Diagnosis not present

## 2017-09-27 DIAGNOSIS — H66001 Acute suppurative otitis media without spontaneous rupture of ear drum, right ear: Secondary | ICD-10-CM | POA: Diagnosis not present

## 2017-10-01 DIAGNOSIS — H6982 Other specified disorders of Eustachian tube, left ear: Secondary | ICD-10-CM | POA: Diagnosis not present

## 2017-10-23 DIAGNOSIS — F419 Anxiety disorder, unspecified: Secondary | ICD-10-CM | POA: Diagnosis not present

## 2017-10-23 DIAGNOSIS — F320 Major depressive disorder, single episode, mild: Secondary | ICD-10-CM | POA: Diagnosis not present

## 2017-10-23 DIAGNOSIS — R5383 Other fatigue: Secondary | ICD-10-CM | POA: Diagnosis not present

## 2017-10-23 DIAGNOSIS — E039 Hypothyroidism, unspecified: Secondary | ICD-10-CM | POA: Diagnosis not present

## 2017-12-10 DIAGNOSIS — E039 Hypothyroidism, unspecified: Secondary | ICD-10-CM | POA: Diagnosis not present

## 2018-01-13 IMAGING — CR DG SACRUM/COCCYX 2+V
3 series · 3 of 3 positions shown · non-contrast
Comparison: Non

CLINICAL DATA: Tail bone pain radiating to LEFT for 2 weeks, no
known injury, awoke 15 days ago with coccygeal pain that has
persisted

EXAM:
SACRUM AND COCCYX - 2+ VIEW

[coccyx ap]
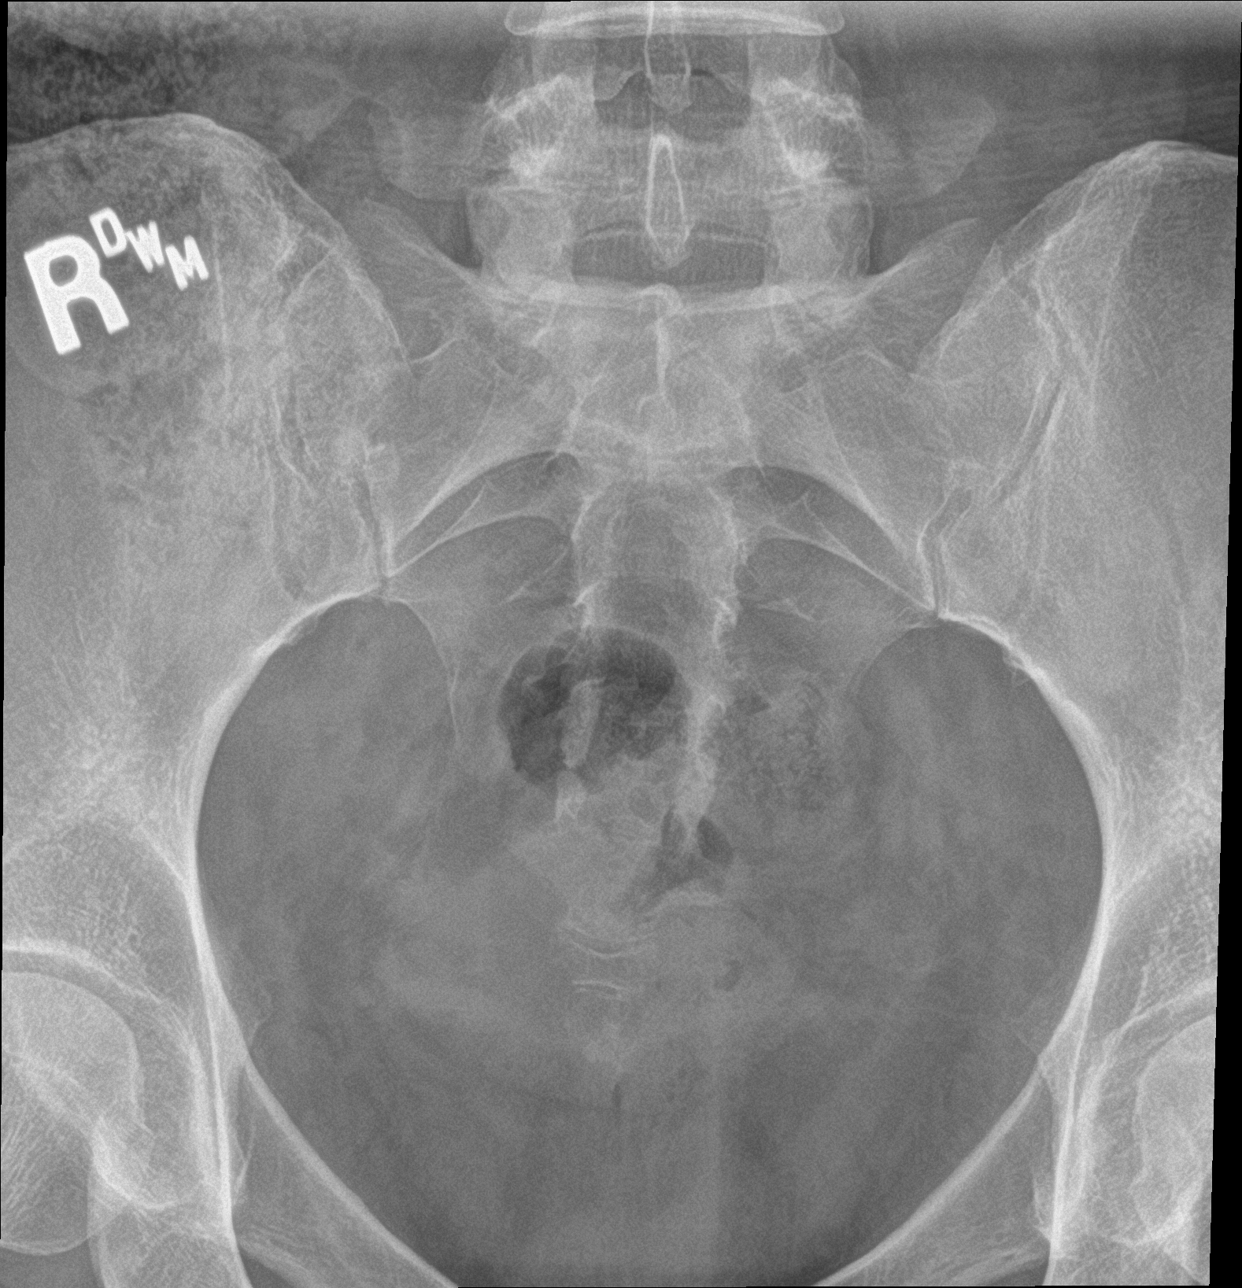

[sacrum ap]
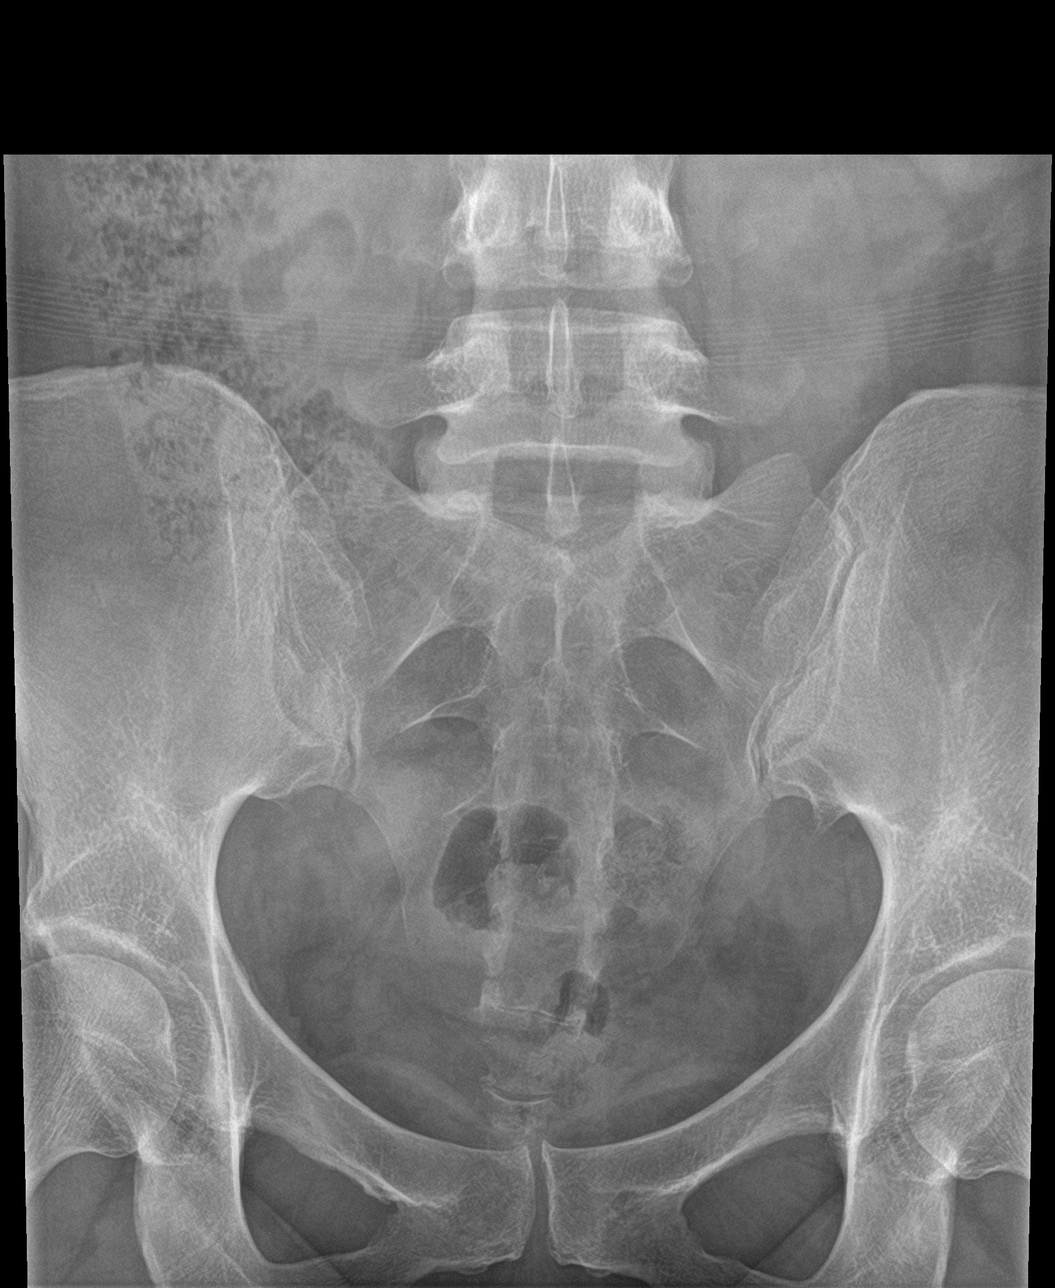

[sacrum lat]
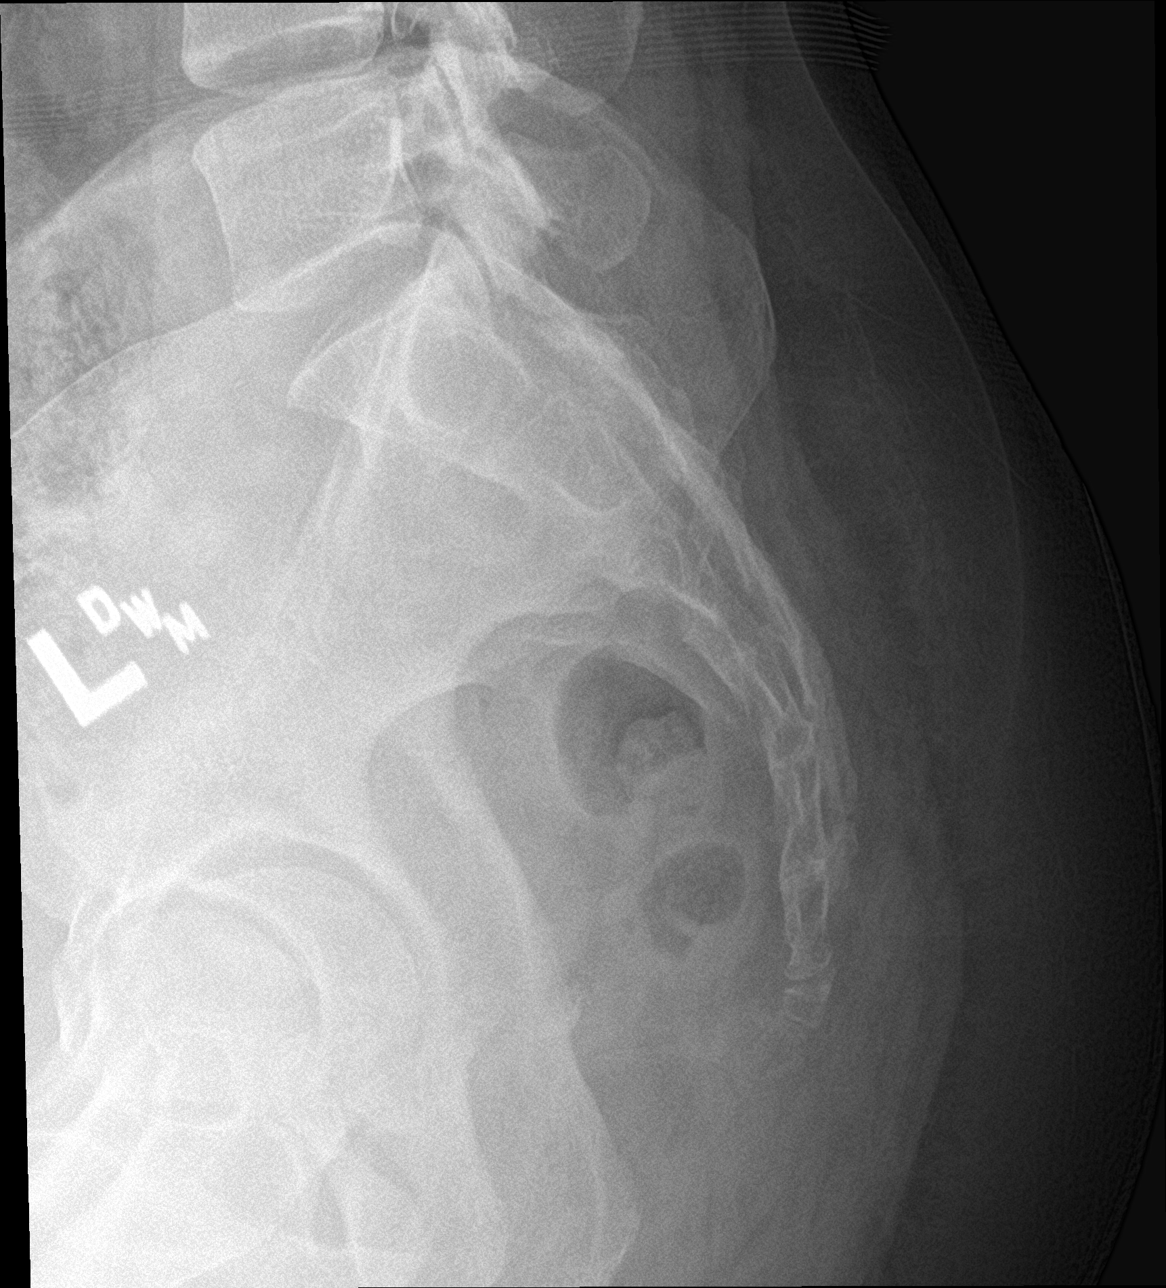

[3 of 3 positions shown; findings below may reference images not displayed]

FINDINGS: SI joints symmetric and preserved.

Osseous mineralization normal.

Symmetric sacral neural foramina.

No sacrococcygeal fracture or bone destruction..
IMPRESSION: No acute abnormalities.

## 2018-01-27 DIAGNOSIS — E039 Hypothyroidism, unspecified: Secondary | ICD-10-CM | POA: Diagnosis not present

## 2018-01-28 DIAGNOSIS — K080 Exfoliation of teeth due to systemic causes: Secondary | ICD-10-CM | POA: Diagnosis not present

## 2018-02-10 DIAGNOSIS — H5210 Myopia, unspecified eye: Secondary | ICD-10-CM | POA: Diagnosis not present

## 2018-02-10 DIAGNOSIS — H5213 Myopia, bilateral: Secondary | ICD-10-CM | POA: Diagnosis not present

## 2018-02-18 DIAGNOSIS — Z23 Encounter for immunization: Secondary | ICD-10-CM | POA: Diagnosis not present

## 2018-02-18 DIAGNOSIS — F419 Anxiety disorder, unspecified: Secondary | ICD-10-CM | POA: Diagnosis not present

## 2018-02-18 DIAGNOSIS — E039 Hypothyroidism, unspecified: Secondary | ICD-10-CM | POA: Diagnosis not present

## 2018-02-18 DIAGNOSIS — F320 Major depressive disorder, single episode, mild: Secondary | ICD-10-CM | POA: Diagnosis not present

## 2018-04-01 DIAGNOSIS — E039 Hypothyroidism, unspecified: Secondary | ICD-10-CM | POA: Diagnosis not present

## 2018-07-10 DIAGNOSIS — F320 Major depressive disorder, single episode, mild: Secondary | ICD-10-CM | POA: Diagnosis not present

## 2018-07-10 DIAGNOSIS — E663 Overweight: Secondary | ICD-10-CM | POA: Diagnosis not present

## 2018-07-10 DIAGNOSIS — F419 Anxiety disorder, unspecified: Secondary | ICD-10-CM | POA: Diagnosis not present

## 2018-07-10 DIAGNOSIS — Z0000 Encounter for general adult medical examination without abnormal findings: Secondary | ICD-10-CM | POA: Diagnosis not present

## 2018-07-10 DIAGNOSIS — E039 Hypothyroidism, unspecified: Secondary | ICD-10-CM | POA: Diagnosis not present

## 2018-07-17 DIAGNOSIS — E039 Hypothyroidism, unspecified: Secondary | ICD-10-CM | POA: Diagnosis not present

## 2018-07-17 DIAGNOSIS — E559 Vitamin D deficiency, unspecified: Secondary | ICD-10-CM | POA: Diagnosis not present

## 2018-07-17 DIAGNOSIS — Z136 Encounter for screening for cardiovascular disorders: Secondary | ICD-10-CM | POA: Diagnosis not present

## 2018-08-08 DIAGNOSIS — E663 Overweight: Secondary | ICD-10-CM | POA: Diagnosis not present

## 2018-09-01 DIAGNOSIS — H5213 Myopia, bilateral: Secondary | ICD-10-CM | POA: Diagnosis not present

## 2018-09-09 DIAGNOSIS — L710 Perioral dermatitis: Secondary | ICD-10-CM | POA: Diagnosis not present

## 2018-09-09 DIAGNOSIS — E663 Overweight: Secondary | ICD-10-CM | POA: Diagnosis not present

## 2018-10-01 DIAGNOSIS — L700 Acne vulgaris: Secondary | ICD-10-CM | POA: Diagnosis not present

## 2018-10-01 DIAGNOSIS — D239 Other benign neoplasm of skin, unspecified: Secondary | ICD-10-CM | POA: Diagnosis not present

## 2018-10-01 DIAGNOSIS — L723 Sebaceous cyst: Secondary | ICD-10-CM | POA: Diagnosis not present

## 2018-10-01 DIAGNOSIS — L710 Perioral dermatitis: Secondary | ICD-10-CM | POA: Diagnosis not present

## 2018-12-03 DIAGNOSIS — E559 Vitamin D deficiency, unspecified: Secondary | ICD-10-CM | POA: Diagnosis not present

## 2018-12-03 DIAGNOSIS — E039 Hypothyroidism, unspecified: Secondary | ICD-10-CM | POA: Diagnosis not present

## 2018-12-03 DIAGNOSIS — F419 Anxiety disorder, unspecified: Secondary | ICD-10-CM | POA: Diagnosis not present

## 2018-12-03 DIAGNOSIS — F320 Major depressive disorder, single episode, mild: Secondary | ICD-10-CM | POA: Diagnosis not present

## 2019-01-30 DIAGNOSIS — H9202 Otalgia, left ear: Secondary | ICD-10-CM | POA: Diagnosis not present

## 2019-02-17 DIAGNOSIS — H9202 Otalgia, left ear: Secondary | ICD-10-CM | POA: Diagnosis not present

## 2019-02-17 DIAGNOSIS — H6982 Other specified disorders of Eustachian tube, left ear: Secondary | ICD-10-CM | POA: Diagnosis not present

## 2019-03-10 DIAGNOSIS — H5213 Myopia, bilateral: Secondary | ICD-10-CM | POA: Diagnosis not present

## 2019-07-14 DIAGNOSIS — E039 Hypothyroidism, unspecified: Secondary | ICD-10-CM | POA: Diagnosis not present

## 2019-07-14 DIAGNOSIS — Z13220 Encounter for screening for lipoid disorders: Secondary | ICD-10-CM | POA: Diagnosis not present

## 2019-07-14 DIAGNOSIS — Z0000 Encounter for general adult medical examination without abnormal findings: Secondary | ICD-10-CM | POA: Diagnosis not present

## 2019-07-14 DIAGNOSIS — E559 Vitamin D deficiency, unspecified: Secondary | ICD-10-CM | POA: Diagnosis not present

## 2019-08-31 DIAGNOSIS — Z23 Encounter for immunization: Secondary | ICD-10-CM | POA: Diagnosis not present

## 2019-09-21 DIAGNOSIS — R6889 Other general symptoms and signs: Secondary | ICD-10-CM | POA: Diagnosis not present

## 2019-09-21 DIAGNOSIS — Z1231 Encounter for screening mammogram for malignant neoplasm of breast: Secondary | ICD-10-CM | POA: Diagnosis not present

## 2019-09-21 DIAGNOSIS — Z6828 Body mass index (BMI) 28.0-28.9, adult: Secondary | ICD-10-CM | POA: Diagnosis not present

## 2019-09-21 DIAGNOSIS — Z01419 Encounter for gynecological examination (general) (routine) without abnormal findings: Secondary | ICD-10-CM | POA: Diagnosis not present

## 2019-09-21 DIAGNOSIS — E039 Hypothyroidism, unspecified: Secondary | ICD-10-CM | POA: Diagnosis not present

## 2019-09-21 DIAGNOSIS — Z3202 Encounter for pregnancy test, result negative: Secondary | ICD-10-CM | POA: Diagnosis not present

## 2019-09-21 DIAGNOSIS — R6882 Decreased libido: Secondary | ICD-10-CM | POA: Diagnosis not present

## 2019-09-21 DIAGNOSIS — G4700 Insomnia, unspecified: Secondary | ICD-10-CM | POA: Diagnosis not present

## 2019-09-21 DIAGNOSIS — N951 Menopausal and female climacteric states: Secondary | ICD-10-CM | POA: Diagnosis not present

## 2019-09-21 DIAGNOSIS — R635 Abnormal weight gain: Secondary | ICD-10-CM | POA: Diagnosis not present

## 2019-09-23 DIAGNOSIS — R0781 Pleurodynia: Secondary | ICD-10-CM | POA: Diagnosis not present

## 2019-09-23 DIAGNOSIS — R1011 Right upper quadrant pain: Secondary | ICD-10-CM | POA: Diagnosis not present

## 2019-10-29 DIAGNOSIS — L723 Sebaceous cyst: Secondary | ICD-10-CM | POA: Diagnosis not present

## 2019-10-30 DIAGNOSIS — H53143 Visual discomfort, bilateral: Secondary | ICD-10-CM | POA: Diagnosis not present

## 2019-10-30 DIAGNOSIS — H524 Presbyopia: Secondary | ICD-10-CM | POA: Diagnosis not present

## 2019-11-10 DIAGNOSIS — H5213 Myopia, bilateral: Secondary | ICD-10-CM | POA: Diagnosis not present

## 2019-11-30 DIAGNOSIS — L720 Epidermal cyst: Secondary | ICD-10-CM | POA: Diagnosis not present

## 2019-11-30 DIAGNOSIS — L723 Sebaceous cyst: Secondary | ICD-10-CM | POA: Diagnosis not present

## 2019-12-14 DIAGNOSIS — L0889 Other specified local infections of the skin and subcutaneous tissue: Secondary | ICD-10-CM | POA: Diagnosis not present

## 2019-12-14 DIAGNOSIS — L905 Scar conditions and fibrosis of skin: Secondary | ICD-10-CM | POA: Diagnosis not present

## 2020-01-19 DIAGNOSIS — E559 Vitamin D deficiency, unspecified: Secondary | ICD-10-CM | POA: Diagnosis not present

## 2020-01-19 DIAGNOSIS — F419 Anxiety disorder, unspecified: Secondary | ICD-10-CM | POA: Diagnosis not present

## 2020-01-19 DIAGNOSIS — F320 Major depressive disorder, single episode, mild: Secondary | ICD-10-CM | POA: Diagnosis not present

## 2020-01-19 DIAGNOSIS — Z23 Encounter for immunization: Secondary | ICD-10-CM | POA: Diagnosis not present

## 2020-01-19 DIAGNOSIS — E039 Hypothyroidism, unspecified: Secondary | ICD-10-CM | POA: Diagnosis not present

## 2020-04-18 DIAGNOSIS — H5213 Myopia, bilateral: Secondary | ICD-10-CM | POA: Diagnosis not present

## 2020-07-14 DIAGNOSIS — E039 Hypothyroidism, unspecified: Secondary | ICD-10-CM | POA: Diagnosis not present

## 2020-07-14 DIAGNOSIS — R635 Abnormal weight gain: Secondary | ICD-10-CM | POA: Diagnosis not present

## 2020-07-14 DIAGNOSIS — N951 Menopausal and female climacteric states: Secondary | ICD-10-CM | POA: Diagnosis not present

## 2020-07-19 DIAGNOSIS — Z1339 Encounter for screening examination for other mental health and behavioral disorders: Secondary | ICD-10-CM | POA: Diagnosis not present

## 2020-07-19 DIAGNOSIS — R232 Flushing: Secondary | ICD-10-CM | POA: Diagnosis not present

## 2020-07-19 DIAGNOSIS — N898 Other specified noninflammatory disorders of vagina: Secondary | ICD-10-CM | POA: Diagnosis not present

## 2020-07-19 DIAGNOSIS — R635 Abnormal weight gain: Secondary | ICD-10-CM | POA: Diagnosis not present

## 2020-07-19 DIAGNOSIS — Z1331 Encounter for screening for depression: Secondary | ICD-10-CM | POA: Diagnosis not present

## 2020-07-19 DIAGNOSIS — N951 Menopausal and female climacteric states: Secondary | ICD-10-CM | POA: Diagnosis not present

## 2020-07-27 DIAGNOSIS — E039 Hypothyroidism, unspecified: Secondary | ICD-10-CM | POA: Diagnosis not present

## 2020-07-27 DIAGNOSIS — Z6828 Body mass index (BMI) 28.0-28.9, adult: Secondary | ICD-10-CM | POA: Diagnosis not present

## 2020-08-03 DIAGNOSIS — E039 Hypothyroidism, unspecified: Secondary | ICD-10-CM | POA: Diagnosis not present

## 2020-08-03 DIAGNOSIS — Z6828 Body mass index (BMI) 28.0-28.9, adult: Secondary | ICD-10-CM | POA: Diagnosis not present

## 2020-08-10 DIAGNOSIS — E039 Hypothyroidism, unspecified: Secondary | ICD-10-CM | POA: Diagnosis not present

## 2020-08-10 DIAGNOSIS — Z6828 Body mass index (BMI) 28.0-28.9, adult: Secondary | ICD-10-CM | POA: Diagnosis not present

## 2020-08-17 DIAGNOSIS — E039 Hypothyroidism, unspecified: Secondary | ICD-10-CM | POA: Diagnosis not present

## 2020-08-17 DIAGNOSIS — N951 Menopausal and female climacteric states: Secondary | ICD-10-CM | POA: Diagnosis not present

## 2020-08-17 DIAGNOSIS — Z6828 Body mass index (BMI) 28.0-28.9, adult: Secondary | ICD-10-CM | POA: Diagnosis not present

## 2020-08-31 DIAGNOSIS — Z6828 Body mass index (BMI) 28.0-28.9, adult: Secondary | ICD-10-CM | POA: Diagnosis not present

## 2020-08-31 DIAGNOSIS — E039 Hypothyroidism, unspecified: Secondary | ICD-10-CM | POA: Diagnosis not present

## 2020-09-08 DIAGNOSIS — E039 Hypothyroidism, unspecified: Secondary | ICD-10-CM | POA: Diagnosis not present

## 2020-09-08 DIAGNOSIS — Z6828 Body mass index (BMI) 28.0-28.9, adult: Secondary | ICD-10-CM | POA: Diagnosis not present

## 2020-09-13 DIAGNOSIS — E039 Hypothyroidism, unspecified: Secondary | ICD-10-CM | POA: Diagnosis not present

## 2020-09-13 DIAGNOSIS — N951 Menopausal and female climacteric states: Secondary | ICD-10-CM | POA: Diagnosis not present

## 2020-09-15 DIAGNOSIS — E039 Hypothyroidism, unspecified: Secondary | ICD-10-CM | POA: Diagnosis not present

## 2020-09-15 DIAGNOSIS — N951 Menopausal and female climacteric states: Secondary | ICD-10-CM | POA: Diagnosis not present

## 2020-09-15 DIAGNOSIS — Z6828 Body mass index (BMI) 28.0-28.9, adult: Secondary | ICD-10-CM | POA: Diagnosis not present

## 2020-09-15 DIAGNOSIS — R232 Flushing: Secondary | ICD-10-CM | POA: Diagnosis not present

## 2020-10-03 DIAGNOSIS — Z1231 Encounter for screening mammogram for malignant neoplasm of breast: Secondary | ICD-10-CM | POA: Diagnosis not present

## 2020-10-03 DIAGNOSIS — Z6829 Body mass index (BMI) 29.0-29.9, adult: Secondary | ICD-10-CM | POA: Diagnosis not present

## 2020-10-03 DIAGNOSIS — Z01419 Encounter for gynecological examination (general) (routine) without abnormal findings: Secondary | ICD-10-CM | POA: Diagnosis not present

## 2020-10-07 DIAGNOSIS — E039 Hypothyroidism, unspecified: Secondary | ICD-10-CM | POA: Diagnosis not present

## 2020-10-07 DIAGNOSIS — Z6828 Body mass index (BMI) 28.0-28.9, adult: Secondary | ICD-10-CM | POA: Diagnosis not present

## 2020-11-17 DIAGNOSIS — E039 Hypothyroidism, unspecified: Secondary | ICD-10-CM | POA: Diagnosis not present

## 2020-11-17 DIAGNOSIS — N951 Menopausal and female climacteric states: Secondary | ICD-10-CM | POA: Diagnosis not present

## 2020-11-23 DIAGNOSIS — N898 Other specified noninflammatory disorders of vagina: Secondary | ICD-10-CM | POA: Diagnosis not present

## 2020-11-23 DIAGNOSIS — R232 Flushing: Secondary | ICD-10-CM | POA: Diagnosis not present

## 2020-11-23 DIAGNOSIS — N951 Menopausal and female climacteric states: Secondary | ICD-10-CM | POA: Diagnosis not present

## 2020-11-23 DIAGNOSIS — F330 Major depressive disorder, recurrent, mild: Secondary | ICD-10-CM | POA: Diagnosis not present

## 2020-12-20 DIAGNOSIS — E559 Vitamin D deficiency, unspecified: Secondary | ICD-10-CM | POA: Diagnosis not present

## 2020-12-20 DIAGNOSIS — E039 Hypothyroidism, unspecified: Secondary | ICD-10-CM | POA: Diagnosis not present

## 2020-12-20 DIAGNOSIS — E663 Overweight: Secondary | ICD-10-CM | POA: Diagnosis not present

## 2020-12-23 DIAGNOSIS — F419 Anxiety disorder, unspecified: Secondary | ICD-10-CM | POA: Diagnosis not present

## 2020-12-23 DIAGNOSIS — E559 Vitamin D deficiency, unspecified: Secondary | ICD-10-CM | POA: Diagnosis not present

## 2020-12-23 DIAGNOSIS — E039 Hypothyroidism, unspecified: Secondary | ICD-10-CM | POA: Diagnosis not present

## 2020-12-23 DIAGNOSIS — F320 Major depressive disorder, single episode, mild: Secondary | ICD-10-CM | POA: Diagnosis not present

## 2021-02-06 DIAGNOSIS — U071 COVID-19: Secondary | ICD-10-CM | POA: Diagnosis not present
